# Patient Record
Sex: Female | Born: 1985 | Race: White | Hispanic: No | Marital: Married | Smoking: Former smoker
Health system: Southern US, Community
[De-identification: ages and names within clinical notes are randomized; demographics above are authoritative.]

## PROBLEM LIST (undated history)

## (undated) DIAGNOSIS — L409 Psoriasis, unspecified: Secondary | ICD-10-CM

## (undated) DIAGNOSIS — E876 Hypokalemia: Secondary | ICD-10-CM

## (undated) DIAGNOSIS — E7800 Pure hypercholesterolemia, unspecified: Secondary | ICD-10-CM

## (undated) DIAGNOSIS — G44209 Tension-type headache, unspecified, not intractable: Secondary | ICD-10-CM

## (undated) DIAGNOSIS — F419 Anxiety disorder, unspecified: Secondary | ICD-10-CM

---

## 2014-09-05 NOTE — L&D Delivery Note (Signed)
Delivery Note At 1:03 AM a viable female was delivered via Vaginal, Spontaneous Delivery (Presentation: LOA).  APGAR: 8, 9; weight 6 lb 15.8 oz (3170 g).   Placenta status: Intact, Spontaneous.  Cord: 3 vessels with the following complications: see below.  Cord pH: n/a  Anesthesia:  Fentanyl PCA and pudendal block Episiotomy:  no Lacerations:  1st degree perineal Suture Repair: 3.0 vicryl Est. Blood Loss (mL):  300cc  Mom to postpartum.  Baby to Couplet care / Skin to Skin.  Intrapartum complications: 1. Meconium staining 2. Bradycardia (low baseline) 3. Prolonged rupture of membranes (>18hrs) 4. WBC of 22 --> 30 12 hrs later  See previous notes; mom became complete approximately 23:30 and immediately began pushing for pain/pressure relief.  After 1.5hrs she had rapid descent and delivered a viable female.  Thick meconium was noted and fully-formed clay-coloured stool.  This was assessed post-delivery by myself and the neonatology NP, which she described as "transitional" stool.  It was not thick and tarry of a typical post-partum meconium stool.  Baby was vigorous upon delivery after bulb suction to the oropharynx, the cord was doubly clamped and cut, but due to baby's activity and cry, she was placed on mom's chest.  Cord blood was collected due to Rh negative.  Cord gas was not collected due to initial apgar of 8.  Placenta was spontaneously delivered, intact, with evidence of partial avulsion of the cord. Culture was taken between the chorion and amnion and the placenta was sent to pathology for evaluation.  A very small/shallow first degree laceration was identified and was oozy.  A figure-of-eight of 3-0 vicryl was placed for hemostasis.  The baby was attended to by the pediatric staff and then placed on mom for skin-to-skin.  We sang Happy [place] OuchBirthday do Alana.    About 30 minutes after delivery, I noted that mom was warm; maternal temp was obtained and was 100.9.  With leukocytosis, chorio  was diagnosed.  Since placenta is delivered, Tylenol was administered and will recheck in 1hr.  If still febrile will give one dose of ABX.    Trong Gosling C 07/25/2015, 1:33 AM

## 2015-05-13 ENCOUNTER — Encounter: Payer: Self-pay | Admitting: Emergency Medicine

## 2015-05-13 ENCOUNTER — Emergency Department
Admission: EM | Admit: 2015-05-13 | Discharge: 2015-05-13 | Disposition: A | Discharge status: Home / Self Care | Payer: Managed Care, Other (non HMO) | Attending: Emergency Medicine | Admitting: Emergency Medicine

## 2015-05-13 ENCOUNTER — Emergency Department: Payer: Managed Care, Other (non HMO)

## 2015-05-13 DIAGNOSIS — Z3A28 28 weeks gestation of pregnancy: Secondary | ICD-10-CM | POA: Diagnosis not present

## 2015-05-13 DIAGNOSIS — R1011 Right upper quadrant pain: Secondary | ICD-10-CM | POA: Diagnosis not present

## 2015-05-13 DIAGNOSIS — O9989 Other specified diseases and conditions complicating pregnancy, childbirth and the puerperium: Secondary | ICD-10-CM | POA: Insufficient documentation

## 2015-05-13 LAB — COMPREHENSIVE METABOLIC PANEL
ALBUMIN: 3.1 g/dL — AB (ref 3.5–5.0)
ALK PHOS: 101 U/L (ref 38–126)
ALT: 22 U/L (ref 14–54)
ANION GAP: 7 (ref 5–15)
AST: 25 U/L (ref 15–41)
BILIRUBIN TOTAL: 0.4 mg/dL (ref 0.3–1.2)
BUN: 6 mg/dL (ref 6–20)
CALCIUM: 8.5 mg/dL — AB (ref 8.9–10.3)
CO2: 21 mmol/L — AB (ref 22–32)
CREATININE: 0.4 mg/dL — AB (ref 0.44–1.00)
Chloride: 108 mmol/L (ref 101–111)
GFR calc Af Amer: >60 mL/min (ref >60)
GFR calc non Af Amer: >60 mL/min (ref >60)
GLUCOSE: 88 mg/dL (ref 65–99)
Potassium: 3.3 mmol/L — ABNORMAL LOW (ref 3.5–5.1)
SODIUM: 136 mmol/L (ref 135–145)
TOTAL PROTEIN: 6.4 g/dL — AB (ref 6.5–8.1)

## 2015-05-13 LAB — CBC WITH DIFFERENTIAL/PLATELET
BASOS ABS: 0.1 10*3/uL (ref 0–0.1)
BASOS PCT: 0 %
EOS PCT: 0 %
Eosinophils Absolute: 0 10*3/uL (ref 0–0.7)
HEMATOCRIT: 36.1 % (ref 35.0–47.0)
Hemoglobin: 12.4 g/dL (ref 12.0–16.0)
Lymphocytes Relative: 10 %
Lymphs Abs: 1.7 10*3/uL (ref 1.0–3.6)
MCH: 31.3 pg (ref 26.0–34.0)
MCHC: 34.4 g/dL (ref 32.0–36.0)
MCV: 91.1 fL (ref 80.0–100.0)
MONO ABS: 1 10*3/uL — AB (ref 0.2–0.9)
MONOS PCT: 6 %
Neutro Abs: 13.7 10*3/uL — ABNORMAL HIGH (ref 1.4–6.5)
Neutrophils Relative %: 84 %
PLATELETS: 361 10*3/uL (ref 150–440)
RBC: 3.96 MIL/uL (ref 3.80–5.20)
RDW: 13.8 % (ref 11.5–14.5)
WBC: 16.5 10*3/uL — ABNORMAL HIGH (ref 3.6–11.0)

## 2015-05-13 LAB — URINALYSIS COMPLETE WITH MICROSCOPIC (ARMC ONLY)
Bilirubin Urine: NEGATIVE
GLUCOSE, UA: 150 mg/dL — AB
Hgb urine dipstick: NEGATIVE
KETONES UR: NEGATIVE mg/dL
Leukocytes, UA: NEGATIVE
NITRITE: NEGATIVE
PROTEIN: NEGATIVE mg/dL
SPECIFIC GRAVITY, URINE: 1.005 (ref 1.005–1.030)
pH: 6 (ref 5.0–8.0)

## 2015-05-13 LAB — LIPASE, BLOOD: Lipase: 19 U/L — ABNORMAL LOW (ref 22–51)

## 2015-05-13 MED ORDER — PANTOPRAZOLE SODIUM 20 MG PO TBEC: 20.0000 mg | DELAYED_RELEASE_TABLET | Freq: Every day | ORAL | Status: DC

## 2015-05-13 MED ORDER — ONDANSETRON HCL 4 MG/2ML IJ SOLN
4.0000 mg | Freq: Once | INTRAMUSCULAR | Status: AC
  Administered 2015-05-13: 4 mg via INTRAVENOUS
  Filled 2015-05-13: qty 2

## 2015-05-13 MED ORDER — ACETAMINOPHEN 500 MG PO TABS
1000.0000 mg | ORAL_TABLET | Freq: Once | ORAL | Status: AC
  Administered 2015-05-13: 1000 mg via ORAL
  Filled 2015-05-13: qty 2

## 2015-05-13 MED ORDER — SODIUM CHLORIDE 0.9 % IV BOLUS (SEPSIS)
500.0000 mL | Freq: Once | INTRAVENOUS | Status: AC
  Administered 2015-05-13: 500 mL via INTRAVENOUS

## 2015-05-13 NOTE — ED Provider Notes (Signed)
Time Seen: Approximately ----------------------------------------- 10:00 AM on 05/13/2015 -----------------------------------------   I have reviewed the triage notes  Chief Complaint: Abdominal Pain   History of Present Illness: Bethany Elliott is a 29 y.o. female who is gravida 2 para 1 with one previous miscarriage. Patient's currently [redacted] weeks pregnant. She states her pregnancy is been proceeding normally. She's noticed over the last month she's had some intermittent right upper quadrant abdominal pain with nausea and vomiting especially in the morning. Eyes any fever, dysuria, hematuria. Patient did see her OB/GYN earlier today and stated about the upper abdominal pain was referred here to emergency department for further evaluation. She states her OB/GYN check fetal heart tones and fell her pregnancy was proceeding normally. She states this pain will radiate to the right flank area. Denies any loose stool or diarrhea, melena or hematochezia. She denies any dysuria or hematuria. States she has had some constipation and a decreased appetite. She denies any hematemesis or biliary emesis   History reviewed. No pertinent past medical history.  There are no active problems to display for this patient.   History reviewed. No pertinent past surgical history.  History reviewed. No pertinent past surgical history.  No current outpatient prescriptions on file.  Allergies:  Review of patient's allergies indicates no known allergies.  Family History: No family history on file.  Social History: Social History  Substance Use Topics  . Smoking status: Never Smoker   . Smokeless tobacco: None  . Alcohol Use: No     Review of Systems:   10 point review of systems was performed and was otherwise negative:  Constitutional: No fever Eyes: No visual disturbances ENT: No sore throat, ear pain Cardiac: No chest pain Respiratory: No shortness of breath, wheezing, or  stridor Abdomen: No abdominal pain, no vomiting, No diarrhea Endocrine: No weight loss, No night sweats Extremities: No peripheral edema, cyanosis Skin: No rashes, easy bruising Neurologic: No focal weakness, trouble with speech or swollowing Urologic: No dysuria, Hematuria, or urinary frequency   Physical Exam:  ED Triage Vitals  Enc Vitals Group     BP 05/13/15 0947 119/68 mmHg     Pulse Rate 05/13/15 0947 88     Resp 05/13/15 0947 16     Temp 05/13/15 0947 98.2 F (36.8 C)     Temp Source 05/13/15 0947 Oral     SpO2 05/13/15 0947 99 %     Weight --      Height --      Head Cir --      Peak Flow --      Pain Score 05/13/15 0947 4     Pain Loc --      Pain Edu? --      Excl. in GC? --     General: Awake , Alert , and Oriented times 3; GCS 15 Head: Normal cephalic , atraumatic Eyes: Pupils equal , round, reactive to light Nose/Throat: No nasal drainage, patent upper airway without erythema or exudate.  Neck: Supple, Full range of motion, No anterior adenopathy or palpable thyroid masses Lungs: Clear to ascultation without wheezes , rhonchi, or rales Heart: Regular rate, regular rhythm without murmurs , gallops , or rubs Abdomen:Lieapold  maneuver show 8 cm above the umbilicus.     Patient has focal tenderness in her right upper quadrant to moderate palpation. Mild guarding no rigidity. Bowel sounds are positive and symmetric in all 4 quadrants. No reproducible lower abdominal pain. Extremities: 2 plus symmetric pulses. No  edema, clubbing or cyanosis Neurologic: normal ambulation, Motor symmetric without deficits, sensory intact Skin: warm, dry, no rashes   Labs:   All laboratory work was reviewed including any pertinent negatives or positives listed below:  Labs Reviewed  COMPREHENSIVE METABOLIC PANEL  CBC WITH DIFFERENTIAL/PLATELET  LIPASE, BLOOD  URINALYSIS COMPLETEWITH MICROSCOPIC ([facility] ONLY)   Laboratory work was reviewed and appears to be within normal limits  for second trimester pregnancy  Radiology:    EXAM: US ABDOMEN LIMITED - RIGHT UPPER QUADRANT  COMPARISON: None.  FINDINGS: Gallbladder:  There is a 3 mm non shadowing mildly echogenic focus along the anterior gallbladder wall. No evidence for gallstones or gallbladder sludge. No gallbladder wall thickening. No pericholecystic fluid. Negative sonographic Murphy sign.  Common bile duct:  Diameter: 3 mm  Liver:  No focal lesion identified. Within normal limits in parenchymal echogenicity.  IMPRESSION: No cholelithiasis or sonographic evidence for acute cholecystitis.  3 mm gallbladder polyp.    I personally reviewed the radiologic studies      ED Course:  Differential diagnosis includes but is not exclusive to acute cholecystitis, intrathoracic causes for epigastric abdominal pain, gastritis, duodenitis, pancreatitis, small bowel or large bowel obstruction, abdominal aortic aneurysm, hernia, gastritis, etc. Patient's pain seems to be focused more toward the epigastric area radiating toward her right upper quadrant region. I felt this was unlikely to be an unusual presentation for acute appendicitis given the patient's second trimester pregnancy and location of the appendix. I'll count is elevated that this would be expected at this time of her pregnancy. Patient's afebrile and her pain seems to be characteristic more of reflux and gastritis. Patient was advised that she needs to follow up with her OB/GYN and place her on proton aches and she's been advised she can take over-the-counter Maalox and Mylanta. Right upper quadrant ultrasound showed no evidence of acute cholecystitis. Liver functions are normal as no indications of HELP syndrome   Assessment:  Right upper quadrant abdominal pain uncertain etiology Second trimester pregnancy   Final Clinical Impression:   Final diagnoses:  Right upper quadrant pain     Plan:  Patient was advised to return immediately  if condition worsens. Patient was advised to follow up with her primary care physician or other specialized physicians involved and in their current assessment.            Jennye Moccasin, MD 05/13/15 [phone]

## 2015-05-13 NOTE — Discharge Instructions (Signed)

## 2015-05-13 NOTE — ED Notes (Signed)
Patient transported to Ultrasound 

## 2015-05-13 NOTE — ED Notes (Addendum)
Patient complains of upper right abd pain. Pt sent here from Dr. Elesa Massed to check gallbladder. LBM  x3days ago.

## 2015-07-24 ENCOUNTER — Encounter: Payer: Self-pay | Admitting: *Deleted

## 2015-07-24 ENCOUNTER — Encounter: Payer: Self-pay | Admitting: Anesthesiology

## 2015-07-24 ENCOUNTER — Inpatient Hospital Stay
Admission: EM | Admit: 2015-07-24 | Discharge: 2015-07-26 | DRG: 775 | Disposition: A | Discharge status: Home / Self Care | Payer: Managed Care, Other (non HMO) | Attending: Obstetrics & Gynecology | Admitting: Obstetrics & Gynecology

## 2015-07-24 DIAGNOSIS — O4290 Premature rupture of membranes, unspecified as to length of time between rupture and onset of labor, unspecified weeks of gestation: Secondary | ICD-10-CM | POA: Diagnosis not present

## 2015-07-24 DIAGNOSIS — O4292 Full-term premature rupture of membranes, unspecified as to length of time between rupture and onset of labor: Principal | ICD-10-CM | POA: Diagnosis present

## 2015-07-24 DIAGNOSIS — Z3A38 38 weeks gestation of pregnancy: Secondary | ICD-10-CM

## 2015-07-24 DIAGNOSIS — IMO0002 Reserved for concepts with insufficient information to code with codable children: Secondary | ICD-10-CM | POA: Diagnosis not present

## 2015-07-24 DIAGNOSIS — Z87891 Personal history of nicotine dependence: Secondary | ICD-10-CM | POA: Diagnosis present

## 2015-07-24 DIAGNOSIS — Z6791 Unspecified blood type, Rh negative: Secondary | ICD-10-CM

## 2015-07-24 DIAGNOSIS — O0993 Supervision of high risk pregnancy, unspecified, third trimester: Secondary | ICD-10-CM

## 2015-07-24 DIAGNOSIS — O26899 Other specified pregnancy related conditions, unspecified trimester: Secondary | ICD-10-CM | POA: Diagnosis present

## 2015-07-24 DIAGNOSIS — O411230 Chorioamnionitis, third trimester, not applicable or unspecified: Secondary | ICD-10-CM | POA: Diagnosis present

## 2015-07-24 DIAGNOSIS — D72829 Elevated white blood cell count, unspecified: Secondary | ICD-10-CM | POA: Diagnosis present

## 2015-07-24 DIAGNOSIS — O411290 Chorioamnionitis, unspecified trimester, not applicable or unspecified: Secondary | ICD-10-CM | POA: Diagnosis not present

## 2015-07-24 LAB — CBC WITH DIFFERENTIAL/PLATELET
BASOS PCT: 0 %
Basophils Absolute: 0 10*3/uL (ref 0–0.1)
EOS ABS: 0 10*3/uL (ref 0–0.7)
Eosinophils Relative: 0 %
HEMATOCRIT: 40 % (ref 35.0–47.0)
HEMOGLOBIN: 13.7 g/dL (ref 12.0–16.0)
LYMPHS ABS: 1.3 10*3/uL (ref 1.0–3.6)
Lymphocytes Relative: 4 %
MCH: 31.1 pg (ref 26.0–34.0)
MCHC: 34.2 g/dL (ref 32.0–36.0)
MCV: 91 fL (ref 80.0–100.0)
MONO ABS: 1.7 10*3/uL — AB (ref 0.2–0.9)
MONOS PCT: 6 %
NEUTROS PCT: 90 %
Neutro Abs: 27.3 10*3/uL — ABNORMAL HIGH (ref 1.4–6.5)
Platelets: 351 10*3/uL (ref 150–440)
RBC: 4.39 MIL/uL (ref 3.80–5.20)
RDW: 13.6 % (ref 11.5–14.5)
WBC: 30.3 10*3/uL — ABNORMAL HIGH (ref 3.6–11.0)

## 2015-07-24 LAB — CBC
HEMATOCRIT: 38.6 % (ref 35.0–47.0)
HEMATOCRIT: 36.9 % (ref 35.0–47.0)
HEMOGLOBIN: 13.1 g/dL (ref 12.0–16.0)
HEMOGLOBIN: 12.5 g/dL (ref 12.0–16.0)
MCH: 30.8 pg (ref 26.0–34.0)
MCH: 30.9 pg (ref 26.0–34.0)
MCHC: 34 g/dL (ref 32.0–36.0)
MCHC: 34 g/dL (ref 32.0–36.0)
MCV: 90.6 fL (ref 80.0–100.0)
MCV: 90.9 fL (ref 80.0–100.0)
Platelets: 382 10*3/uL (ref 150–440)
Platelets: 365 10*3/uL (ref 150–440)
RBC: 4.26 MIL/uL (ref 3.80–5.20)
RBC: 4.06 MIL/uL (ref 3.80–5.20)
RDW: 13.7 % (ref 11.5–14.5)
RDW: 13.9 % (ref 11.5–14.5)
WBC: 22.3 10*3/uL — AB (ref 3.6–11.0)
WBC: 31.5 10*3/uL — ABNORMAL HIGH (ref 3.6–11.0)

## 2015-07-24 LAB — ABO/RH: ABO/RH(D): O NEG

## 2015-07-24 LAB — TYPE AND SCREEN
ABO/RH(D): O NEG
ANTIBODY SCREEN: POSITIVE

## 2015-07-24 MED ORDER — LIDOCAINE HCL (PF) 1 % IJ SOLN
30.0000 mL | INTRAMUSCULAR | Status: DC | PRN
  Filled 2015-07-24: qty 30

## 2015-07-24 MED ORDER — DIPHENHYDRAMINE HCL 50 MG/ML IJ SOLN: 12.5000 mg | Freq: Four times a day (QID) | INTRAMUSCULAR | Status: DC | PRN

## 2015-07-24 MED ORDER — NALOXONE HCL 0.4 MG/ML IJ SOLN: 0.4000 mg | INTRAMUSCULAR | Status: DC | PRN

## 2015-07-24 MED ORDER — LACTATED RINGERS IV SOLN: 500.0000 mL | INTRAVENOUS | Status: DC | PRN

## 2015-07-24 MED ORDER — TERBUTALINE SULFATE 1 MG/ML IJ SOLN: 0.2500 mg | Freq: Once | INTRAMUSCULAR | Status: DC | PRN

## 2015-07-24 MED ORDER — FENTANYL 40 MCG/ML IV SOLN
INTRAVENOUS | Status: DC
  Administered 2015-07-24: 15:00:00 via INTRAVENOUS
  Filled 2015-07-24: qty 25

## 2015-07-24 MED ORDER — OXYTOCIN 40 UNITS IN LACTATED RINGERS INFUSION - SIMPLE MED
62.5000 mL/h | INTRAVENOUS | Status: DC
  Administered 2015-07-25: 62.5 mL/h via INTRAVENOUS
  Filled 2015-07-24: qty 1000

## 2015-07-24 MED ORDER — BUTORPHANOL TARTRATE 1 MG/ML IJ SOLN
1.0000 mg | INTRAMUSCULAR | Status: DC | PRN
  Administered 2015-07-24: 1 mg via INTRAVENOUS
  Filled 2015-07-24: qty 1

## 2015-07-24 MED ORDER — FENTANYL CITRATE (PF) 100 MCG/2ML IJ SOLN
50.0000 ug | INTRAMUSCULAR | Status: DC | PRN
  Administered 2015-07-24 (×2): 50 ug via INTRAVENOUS

## 2015-07-24 MED ORDER — DIPHENHYDRAMINE HCL 12.5 MG/5ML PO ELIX
12.5000 mg | ORAL_SOLUTION | Freq: Four times a day (QID) | ORAL | Status: DC | PRN
  Filled 2015-07-24: qty 5

## 2015-07-24 MED ORDER — FENTANYL CITRATE (PF) 100 MCG/2ML IJ SOLN
INTRAMUSCULAR | Status: AC
  Administered 2015-07-24: 50 ug via INTRAVENOUS
  Filled 2015-07-24: qty 2

## 2015-07-24 MED ORDER — OXYTOCIN BOLUS FROM INFUSION
500.0000 mL | INTRAVENOUS | Status: DC
  Administered 2015-07-25: 500 mL via INTRAVENOUS

## 2015-07-24 MED ORDER — FENTANYL 40 MCG/ML IV SOLN: INTRAVENOUS | Status: DC

## 2015-07-24 MED ORDER — OXYTOCIN 40 UNITS IN LACTATED RINGERS INFUSION - SIMPLE MED
1.0000 m[IU]/min | INTRAVENOUS | Status: DC
  Administered 2015-07-24: 1 m[IU]/min via INTRAVENOUS

## 2015-07-24 MED ORDER — SODIUM CHLORIDE 0.9 % IJ SOLN: 9.0000 mL | INTRAMUSCULAR | Status: DC | PRN

## 2015-07-24 MED ORDER — ACETAMINOPHEN 325 MG PO TABS
650.0000 mg | ORAL_TABLET | ORAL | Status: DC | PRN
  Administered 2015-07-25: 650 mg via ORAL
  Filled 2015-07-24: qty 2

## 2015-07-24 MED ORDER — CITRIC ACID-SODIUM CITRATE 334-500 MG/5ML PO SOLN
30.0000 mL | ORAL | Status: DC | PRN
  Filled 2015-07-24: qty 30

## 2015-07-24 MED ORDER — LACTATED RINGERS IV SOLN
INTRAVENOUS | Status: DC
  Administered 2015-07-24 (×3): via INTRAVENOUS

## 2015-07-24 MED ORDER — ONDANSETRON HCL 4 MG/2ML IJ SOLN
4.0000 mg | Freq: Four times a day (QID) | INTRAMUSCULAR | Status: DC | PRN
  Administered 2015-07-24 (×2): 4 mg via INTRAVENOUS
  Filled 2015-07-24 (×2): qty 2

## 2015-07-24 MED ORDER — ONDANSETRON HCL 4 MG/2ML IJ SOLN: 4.0000 mg | Freq: Four times a day (QID) | INTRAMUSCULAR | Status: DC | PRN

## 2015-07-24 NOTE — Progress Notes (Deleted)
Bethany Elliott   Your procedure is scheduled on: 07/24/15   Report to [facility] at 0500.             [facility]             [address]             [place], [place] [id number]  Call this number if you have problems the morning of surgery: [phone]   Remember:   Do not eat food or drink liquids after midnight.     Do not wear jewelry, make-up or nail polish.  Do not wear lotions, powders, or perfumes. You may wear deodorant.  Do not shave prior to surgery.  Do not bring valuables to the hospital.  [facility] is not responsible for any            belongings or valuables.                Contacts, dentures or bridgework may not be worn into surgery.  Leave suitcase in the car. After surgery it may be brought to your room.  For patients admitted to the hospital, discharge time is determined by your             treatment team.               Special Instructions: Preparing the skin before Cesarean Section              To help prevent the risk of infection at your surgical site, we are providing             you with rinse-free Sage 2% Chlorhexidine Gluconate (HCG) disposable             wipes.               The night before surgery:              1. Shower or bathe with warm water             2. Do not apply lotion or perfume             3. Wait one hour after shower, skin should be dry and cool             4. Open Sage wipe package - 2 disposable cloths are inside             5. Wipe the lower abdomen from the pubic line to the navel and hip bone to hip             bone with one cloth             6. Use the second cloth to wipe the front of the upper thighs             7. Allow the area to dry for one minute. DO NOT RINSE             8. Skin may feel "tacky" for several minutes             9. Dress in freshly laundered, clean clothes           10. Do not shower the morning of surgery    Please read over the following fact sheets  that you were given: Coughing and            Deep Breathing and Surgical  Site Infection Prevention

## 2015-07-24 NOTE — Progress Notes (Addendum)
L&D Progress Note  29 year old G1 P0 with PROM at 0200 this AM with labor c/b meconium stained amniotic fluid and fetal bradycardia earlier today. Fetal bradycardia and variable decelerations resolved when low blood pressure resolved (probably due to SE from Stadol). Unable to get epidural due to elevated WBC. Has a Fentanyl PCA currently   S: Feeling more pressure  O:BP 123/64 mmHg  Pulse 65  Temp(Src) 97.7 F (36.5 C) (Oral)  Resp 20  SpO2 98%  LMP 09/11/2014  General : gripping hand rails and breathing thru contractions [id number] with accelerations and moderate variability Cervix: 5/C/0 (was 5cm 2 hours ago-was 2 cm on arrival) IUPC inserted: Contractions q 6 min and mvus<100 Ultrasound: baby is LOA  A: PROM x 15 hours, with inadequate labor Cat 1 FHR  P: Pitocin augmentation  Tameya Kuznia, CNM

## 2015-07-24 NOTE — H&P (Signed)
OB History & Physical   History of Present Illness:  Chief Complaint:   HPI:  Bethany Elliott is a 29 y.o. G1P0 female at 5016w4d dated by 8wk US with EDC of 08/03/15.  She presents to L&D for gross rupture of meconium-particulate fluid at 02:00.  +FM, no CTX, no VB  Pregnancy Issues: 1. Rh neg 2. Meconium SROM 3. ASCUS PAP HPV+, colpo neg   Maternal Medical History:  History reviewed. No pertinent past medical history.  History reviewed. No pertinent past surgical history.  No Known Allergies  Prior to Admission medications   Medication Sig Start Date End Date Taking? Authorizing Provider  pantoprazole (PROTONIX) 20 MG tablet Take 1 tablet (20 mg total) by mouth daily. 05/13/15   Jennye MoccasinBrian S Quigley, MD  Prenatal Vit-Fe Fumarate-FA (MULTIVITAMIN-PRENATAL) 27-0.8 MG TABS tablet Take 1 tablet by mouth daily at 12 noon.    Historical Provider, MD     Prenatal care site: [facility]   Social History: She  reports that she quit smoking about 9 months ago. Her smoking use included Cigarettes. She smoked 0.25 packs per day. She does not have any smokeless tobacco history on file. She reports that she does not drink alcohol or use illicit drugs.  Family History: family history is not on file.   Review of Systems: Negative x 10 systems reviewed except as noted in the HPI.     Physical Exam:  Vital Signs: LMP 09/11/2014 General: no acute distress, actively vomiting since onset of labor HEENT: normocephalic, atraumatic Heart: regular rate & rhythm.  No murmurs/rubs/gallops Lungs: clear to auscultation bilaterally, normal respiratory effort Abdomen: soft, gravid, non-tender;  EFW: 8.1 Pelvic:   External: Normal external female genitalia  Cervix: Dilation: 3 / Effacement (%): 90 / Station: 0    Extremities: non-tender, symmetric,0 edema bilaterally.  DTRs: 2+ Neurologic: Alert & oriented x 3.    No results found for this or any previous visit (from the past 24  hour(s)).  Pertinent Results:  Prenatal Labs: Blood type/Rh O neg  Antibody screen Neg  Rubella Immune  Varicella Immune  RPR NR  HBsAg Neg  HIV NR  GC neg  Chlamydia neg  Genetic screening negative  1 hour GTT 118  3 hour GTT   GBS neg   FHT: 110 mod + accels + variable decels TOCO:q3-4 min SVE: made change to 4/100/0 posterior Cephalic by leopolds  Assessment:  Bethany Daubshley Auerbach is a 29 y.o. G1P0 female at 4616w4d with SROM and meconium staining.   Plan:  1. Admit to Labor & Delivery 2. CBC, T&S, Clrs, IVF 3. GBS  Neg 4. Consents obtained. 5. Continuous efm/toco 6. Notify peds of meconium 7. Labor floor currently full; will manage in Triage room until a L&D room becomes available.   8. Emesis:  Give zofran via IV now 9. Stadol for contraction pain until results of CBC return and platelet count WNL.  ----- Ranae Plumberhelsea Ward, MD Attending Obstetrician and Gynecologist [facility]

## 2015-07-24 NOTE — Plan of Care (Signed)
Fetal heart rate 100. Pt repositioned. o2 on at 10l via mask

## 2015-07-24 NOTE — Plan of Care (Signed)
Dr ward notified and in room. Attempted to apply internal scalp electrode. Marland Kitchen. Unsuccessful due to posterior position of baby

## 2015-07-24 NOTE — Progress Notes (Signed)
Intrapartum progress note  I have spent the last ~hour in the patient's room, this is a summary of that time.  S: patient uncomfortable with contractions and continues to vomit frequently. S/p Stadol and zofran in triage prior to transfer to delivery room.  O: BP 78/51--> 71/51 --> 116/47   P 74  R irratic due to emesis  O2 100% with facemask.  FHT: baseline: 98  Moderate variablilty, + accels, + variables TOCO:  q355min (tracing irregular due to emesis) SVE: 3-4 100% 0 station  FSE attempted to be placed x 2. Difficult to keep on due to very posterior cervix.  Accelerations with incidental fetal head stimulation  Results for orders placed or performed during the hospital encounter of 07/24/15 (from the past 24 hour(s))  Type and screen [facility]     Status: None   Collection Time: 07/24/15  9:00 AM  Result Value Ref Range   ABO/RH(D) O NEG    Antibody Screen POS    Sample Expiration 07/27/2015    Antibody Identification PASSIVELY ACQUIRED ANTI-D   CBC     Status: Abnormal   Collection Time: 07/24/15  9:01 AM  Result Value Ref Range   WBC 22.3 (H) 3.6 - 11.0 K/uL   RBC 4.26 3.80 - 5.20 MIL/uL   Hemoglobin 13.1 12.0 - 16.0 g/dL   HCT 16.138.6 09.635.0 - 04.547.0 %   MCV 90.6 80.0 - 100.0 fL   MCH 30.8 26.0 - 34.0 pg   MCHC 34.0 32.0 - 36.0 g/dL   RDW 40.913.7 81.111.5 - 91.414.5 %   Platelets 382 150 - 440 K/uL  ABO/Rh     Status: None   Collection Time: 07/24/15  9:01 AM  Result Value Ref Range   ABO/RH(D) O NEG    A/P: 29yo G1P0 @ 38.4 with SROM with meconium staining, and resolving hypotension, bradycardia.  1. IUP: category 2 tracing.  With moderate variability and accelerations, oxygenation is not compromised at this point.  I have been cautiously watching the fetal activity and so far there hasn't been a significant deviation in baseline nor activity of the strip.  I am guarded and have expressed this to the patient and her family, with any signs of compromise I will not  hesitate to perform a cesarean delivery.  They understand this plan.   2. Leukocytosis: patient states she is just recovering from a cold, but otherwise denies any fever/chills, current feeling of malaise, diarrhea, pain other than labor.  Epidural may not be possible due to this elevation.  Will recheck later today. 3. Meconium staining: peds aware 4. Bradycardia: thus far has not been transient.  Not sure if this is a result of stadol, hypotension, or if due to an unknown cause.  Differential includes heart block.   5. Hypotension: also could be secondary to Stadol, normal BPs in office were 100s/60s.  At time of note, they have started to resolve.  Patient vomiting often, could be secondary to this as well.   This is a complex patient, will monitor closely throughout her labor and determine if we should continue with expectant management/active management or move to cesarean.    ----- Ranae Plumberhelsea Ward, MD Attending Obstetrician and Gynecologist [facility]

## 2015-07-24 NOTE — Anesthesia Preprocedure Evaluation (Deleted)
Anesthesia Evaluation   Patient awake    Reviewed: Allergy & Precautions, NPO status   Airway Mallampati: II       Dental no notable dental hx.    Pulmonary former smoker,    Pulmonary exam normal        Cardiovascular Exercise Tolerance: Good  Rhythm:Regular Rate:Normal     Neuro/Psych    GI/Hepatic negative GI ROS, Neg liver ROS,   Endo/Other  negative endocrine ROS  Renal/GU negative Renal ROS     Musculoskeletal negative musculoskeletal ROS (+)   Abdominal Normal abdominal exam  (+)   Peds  Hematology negative hematology ROS (+)   Anesthesia Other Findings   Reproductive/Obstetrics negative OB ROS                            Anesthesia Physical Anesthesia Plan  ASA: II and emergent  Anesthesia Plan: Spinal   Post-op Pain Management:    Induction:   Airway Management Planned: Nasal Cannula  Additional Equipment:   Intra-op Plan:   Post-operative Plan:   Informed Consent: I have reviewed the patients History and Physical, chart, labs and discussed the procedure including the risks, benefits and alternatives for the proposed anesthesia with the patient or authorized representative who has indicated his/her understanding and acceptance.     Plan Discussed with: Surgeon  Anesthesia Plan Comments:         Anesthesia Quick Evaluation

## 2015-07-25 DIAGNOSIS — IMO0002 Reserved for concepts with insufficient information to code with codable children: Secondary | ICD-10-CM | POA: Diagnosis not present

## 2015-07-25 DIAGNOSIS — O411290 Chorioamnionitis, unspecified trimester, not applicable or unspecified: Secondary | ICD-10-CM | POA: Diagnosis not present

## 2015-07-25 DIAGNOSIS — O26899 Other specified pregnancy related conditions, unspecified trimester: Secondary | ICD-10-CM | POA: Diagnosis present

## 2015-07-25 DIAGNOSIS — Z6791 Unspecified blood type, Rh negative: Secondary | ICD-10-CM

## 2015-07-25 DIAGNOSIS — O0993 Supervision of high risk pregnancy, unspecified, third trimester: Secondary | ICD-10-CM

## 2015-07-25 DIAGNOSIS — O4290 Premature rupture of membranes, unspecified as to length of time between rupture and onset of labor, unspecified weeks of gestation: Secondary | ICD-10-CM | POA: Diagnosis not present

## 2015-07-25 DIAGNOSIS — D72829 Elevated white blood cell count, unspecified: Secondary | ICD-10-CM | POA: Diagnosis present

## 2015-07-25 LAB — CBC
HCT: 34.8 % — ABNORMAL LOW (ref 35.0–47.0)
Hemoglobin: 11.7 g/dL — ABNORMAL LOW (ref 12.0–16.0)
MCH: 30.3 pg (ref 26.0–34.0)
MCHC: 33.6 g/dL (ref 32.0–36.0)
MCV: 90.1 fL (ref 80.0–100.0)
PLATELETS: 356 10*3/uL (ref 150–440)
RBC: 3.86 MIL/uL (ref 3.80–5.20)
RDW: 13.8 % (ref 11.5–14.5)
WBC: 38.9 10*3/uL — ABNORMAL HIGH (ref 3.6–11.0)

## 2015-07-25 LAB — RPR: RPR: NONREACTIVE

## 2015-07-25 LAB — FETAL SCREEN: Fetal Screen: NEGATIVE

## 2015-07-25 MED ORDER — SIMETHICONE 80 MG PO CHEW: 80.0000 mg | CHEWABLE_TABLET | ORAL | Status: DC | PRN

## 2015-07-25 MED ORDER — DIPHENHYDRAMINE HCL 25 MG PO CAPS: 25.0000 mg | ORAL_CAPSULE | Freq: Four times a day (QID) | ORAL | Status: DC | PRN

## 2015-07-25 MED ORDER — WITCH HAZEL-GLYCERIN EX PADS: 1.0000 "application " | MEDICATED_PAD | CUTANEOUS | Status: DC | PRN

## 2015-07-25 MED ORDER — LANOLIN HYDROUS EX OINT: TOPICAL_OINTMENT | CUTANEOUS | Status: DC | PRN

## 2015-07-25 MED ORDER — RHO D IMMUNE GLOBULIN 1500 UNIT/2ML IJ SOSY
300.0000 ug | PREFILLED_SYRINGE | Freq: Once | INTRAMUSCULAR | Status: AC
  Administered 2015-07-25: 300 ug via INTRAMUSCULAR
  Filled 2015-07-25: qty 2

## 2015-07-25 MED ORDER — BENZOCAINE-MENTHOL 20-0.5 % EX AERO
1.0000 "application " | INHALATION_SPRAY | CUTANEOUS | Status: DC | PRN
  Filled 2015-07-25: qty 56

## 2015-07-25 MED ORDER — OXYTOCIN 40 UNITS IN LACTATED RINGERS INFUSION - SIMPLE MED: 62.5000 mL/h | INTRAVENOUS | Status: DC | PRN

## 2015-07-25 MED ORDER — ACETAMINOPHEN 325 MG PO TABS: 650.0000 mg | ORAL_TABLET | ORAL | Status: DC | PRN

## 2015-07-25 MED ORDER — DIBUCAINE 1 % RE OINT
1.0000 | TOPICAL_OINTMENT | RECTAL | Status: DC | PRN
Start: 2015-07-25 — End: 2015-07-26

## 2015-07-25 MED ORDER — ONDANSETRON HCL 4 MG/2ML IJ SOLN: 4.0000 mg | INTRAMUSCULAR | Status: DC | PRN

## 2015-07-25 MED ORDER — ONDANSETRON HCL 4 MG PO TABS: 4.0000 mg | ORAL_TABLET | ORAL | Status: DC | PRN

## 2015-07-25 MED ORDER — DOCUSATE SODIUM 100 MG PO CAPS
100.0000 mg | ORAL_CAPSULE | Freq: Two times a day (BID) | ORAL | Status: DC
  Administered 2015-07-25 – 2015-07-26 (×2): 100 mg via ORAL
  Filled 2015-07-25 (×2): qty 1

## 2015-07-25 MED ORDER — IBUPROFEN 600 MG PO TABS
600.0000 mg | ORAL_TABLET | Freq: Four times a day (QID) | ORAL | Status: DC
  Administered 2015-07-25 – 2015-07-26 (×2): 600 mg via ORAL
  Filled 2015-07-25 (×6): qty 1

## 2015-07-25 MED ORDER — OXYCODONE-ACETAMINOPHEN 5-325 MG PO TABS: 1.0000 | ORAL_TABLET | ORAL | Status: DC | PRN

## 2015-07-25 MED ORDER — SODIUM CHLORIDE 0.9 % IJ SOLN
3.0000 mL | Freq: Four times a day (QID) | INTRAMUSCULAR | Status: DC
  Administered 2015-07-25 – 2015-07-26 (×3): 3 mL via INTRAVENOUS

## 2015-07-25 MED ORDER — PRENATAL MULTIVITAMIN CH
1.0000 | ORAL_TABLET | Freq: Every day | ORAL | Status: DC
  Administered 2015-07-25 – 2015-07-26 (×2): 1 via ORAL
  Filled 2015-07-25 (×2): qty 1

## 2015-07-25 NOTE — Discharge Summary (Signed)
Obstetrical Discharge Summary  Patient Name: Bethany Elliott DOB: 1986-04-09 MRN: [id number]  Date of Admission: 07/24/2015 Date of Discharge: 07/26/2015  Primary OB: [facility]  Gestational Age at Delivery: [redacted]w[redacted]d   Antepartum complications: Rh negative  Admitting Diagnosis: SROM with meconium  Secondary Diagnosis: prolonged rupture of membranes, chorioamnionitis, leukocytosis, Rh negative Patient Active Problem List   Diagnosis Date Noted  . Rh negative, delivered, current hospitalization 07/25/2015  . Leukocytosis 07/25/2015  . Postpartum care following vaginal delivery 07/24/2015    Augmentation: Pitocin Complications: Intrauterine Inflammation or infection (Chorioamniotis)/ leukocytosis Intrapartum complications/course: Bethany Elliott presented to L&D after spontaneously rupturing at 02:00 for meconium.  Bethany Elliott was violently vomiting throughout the course of Bethany Elliott admission.  Fetal bradycardia was evident, baseline, but with moderate variability and accels.  FSE was placed.  IUPC placed later in the day after no progress for >2 hours.  Pitocin started for augmentation.  Due to leukocytosis present on admission, epidural was not an option.  A fentanyl PCA was administered.  Bethany Elliott well, and became complete approximately 23:30 and immediately began pushing for pain/pressure relief.  After 1.5hrs Bethany Elliott had rapid descent and delivered a viable female.  Thick meconium was noted and fully-formed clay-coloured stool.  This was assessed post-delivery by myself and the neonatology NP, which Bethany Elliott described as "transitional" stool.  It was not thick and tarry of a typical post-partum meconium stool.  Bethany Elliott was vigorous upon delivery after bulb suction to the oropharynx, the cord was doubly clamped and cut, but due to Bethany Elliott's activity and cry, Bethany Elliott was placed on Bethany Elliott's chest.  Cord blood was collected due to Rh negative.  Cord gas was not collected due to initial apgar of 8.  Placenta was spontaneously delivered,  intact, with evidence of partial avulsion of the cord. Culture was taken between the chorion and amnion and the placenta was sent to pathology for evaluation.  A very small/shallow first degree laceration was identified and was oozy.  A figure-of-eight of 3-0 vicryl was placed for hemostasis.  The Bethany Elliott was attended to by the pediatric staff and then placed on Bethany Elliott for skin-to-skin.  We sang Happy [place] Ouch do Alana.    About 30 minutes after delivery, Bethany Elliott noted that Bethany Elliott was warm; maternal temp was obtained and was 100.9.  With leukocytosis, chorio was diagnosed.     Date of Delivery: 07/25/15 Delivered By: Leeroy Bock Ward Delivery Type: spontaneous vaginal delivery Anesthesia: epidural Placenta: sponatneous Laceration: 1sr degree Episiotomy: none Newborn Data: Live born female/ Alana/ Bottle  Birth Weight: 6 lb 15.8 oz (3170 g) APGAR: 8, 9    Discharge Physical Exam: BP 112/55 mmHg  Pulse 70  Temp(Src) 97.8 F (36.6 C) (Oral)  Resp 16  SpO2 98%  LMP 09/11/2014  Breastfeeding? Unknown  General: NAD ABD: s/nd/nt, Elliott firm and below the umbilicus Lochia: moderate  DVT Evaluation: LE non-ttp, no evidence of DVT on exam.  Results for orders placed or performed during the hospital encounter of 07/24/15 (from the past 72 hour(s))  Type and screen [facility]     Status: None   Collection Time: 07/24/15  9:00 AM  Result Value Ref Range   ABO/RH(D) O NEG    Antibody Screen POS    Sample Expiration 07/27/2015    Antibody Identification PASSIVELY ACQUIRED ANTI-D   CBC     Status: Abnormal   Collection Time: 07/24/15  9:01 AM  Result Value Ref Range   WBC 22.3 (H) 3.6 - 11.0 K/uL   RBC  4.26 3.80 - 5.20 MIL/uL   Hemoglobin 13.1 12.0 - 16.0 g/dL   HCT 16.1 09.6 - 04.5 %   MCV 90.6 80.0 - 100.0 fL   MCH 30.8 26.0 - 34.0 pg   MCHC 34.0 32.0 - 36.0 g/dL   RDW 40.9 81.1 - 91.4 %   Platelets 382 150 - 440 K/uL  RPR     Status: None   Collection Time: 07/24/15  9:01  AM  Result Value Ref Range   RPR Ser Ql Non Reactive Non Reactive    Comment: (NOTE) Performed At: [facility] [address] [place], [place] [id number] Mila Homer MD [id number]   ABO/Rh     Status: None   Collection Time: 07/24/15  9:01 AM  Result Value Ref Range   ABO/RH(D) O NEG   CBC     Status: Abnormal   Collection Time: 07/24/15 10:23 PM  Result Value Ref Range   WBC 31.5 (H) 3.6 - 11.0 K/uL   RBC 4.06 3.80 - 5.20 MIL/uL   Hemoglobin 12.5 12.0 - 16.0 g/dL   HCT 52.8 41.3 - 24.4 %   MCV 90.9 80.0 - 100.0 fL   MCH 30.9 26.0 - 34.0 pg   MCHC 34.0 32.0 - 36.0 g/dL   RDW 01.0 27.2 - 53.6 %   Platelets 365 150 - 440 K/uL  CBC with Differential/Platelet     Status: Abnormal   Collection Time: 07/24/15 10:45 PM  Result Value Ref Range   WBC 30.3 (H) 3.6 - 11.0 K/uL   RBC 4.39 3.80 - 5.20 MIL/uL   Hemoglobin 13.7 12.0 - 16.0 g/dL   HCT 64.4 03.4 - 74.2 %   MCV 91.0 80.0 - 100.0 fL   MCH 31.1 26.0 - 34.0 pg   MCHC 34.2 32.0 - 36.0 g/dL   RDW 59.5 63.8 - 75.6 %   Platelets 351 150 - 440 K/uL   Neutrophils Relative % 90 %   Neutro Abs 27.3 (H) 1.4 - 6.5 K/uL   Lymphocytes Relative 4 %   Lymphs Abs 1.3 1.0 - 3.6 K/uL   Monocytes Relative 6 %   Monocytes Absolute 1.7 (H) 0.2 - 0.9 K/uL   Eosinophils Relative 0 %   Eosinophils Absolute 0.0 0 - 0.7 K/uL   Basophils Relative 0 %   Basophils Absolute 0.0 0 - 0.1 K/uL  Culture, blood (routine x 2)     Status: None (Preliminary result)   Collection Time: 07/24/15 10:46 PM  Result Value Ref Range   Specimen Description BLOOD RIGHT ASSIST CONTROL    Special Requests BOTTLES DRAWN AEROBIC AND ANAEROBIC 7CC    Culture NO GROWTH 2 DAYS    Report Status PENDING   Culture, blood (routine x 2)     Status: None (Preliminary result)   Collection Time: 07/24/15 10:46 PM  Result Value Ref Range   Specimen Description BLOOD LEFT HAND    Special Requests BOTTLES DRAWN AEROBIC AND ANAEROBIC 7CC    Culture NO GROWTH 2  DAYS    Report Status PENDING   Wound culture     Status: None (Preliminary result)   Collection Time: 07/25/15  1:36 AM  Result Value Ref Range   Specimen Description ABSCESS    Special Requests NONE    Gram Stain PENDING    Culture NO GROWTH 1 DAY    Report Status PENDING   Fetal screen     Status: None   Collection Time: 07/25/15  5:15 AM  Result Value Ref Range   Fetal Screen NEG   Rhogam injection     Status: None   Collection Time: 07/25/15  5:15 AM  Result Value Ref Range   Unit Number [id number]    Blood Component Type RHIG    Unit division 00    Status of Unit ISSUED,FINAL    Transfusion Status OK TO TRANSFUSE   CBC     Status: Abnormal   Collection Time: 07/25/15  5:15 AM  Result Value Ref Range   WBC 38.9 (H) 3.6 - 11.0 K/uL   RBC 3.86 3.80 - 5.20 MIL/uL   Hemoglobin 11.7 (L) 12.0 - 16.0 g/dL   HCT 29.5 (L) 62.1 - 30.8 %   MCV 90.1 80.0 - 100.0 fL   MCH 30.3 26.0 - 34.0 pg   MCHC 33.6 32.0 - 36.0 g/dL   RDW 65.7 84.6 - 96.2 %   Platelets 356 150 - 440 K/uL  CBC     Status: Abnormal   Collection Time: 07/26/15 11:13 AM  Result Value Ref Range   WBC 24.8 (H) 3.6 - 11.0 K/uL   RBC 3.98 3.80 - 5.20 MIL/uL   Hemoglobin 12.6 12.0 - 16.0 g/dL   HCT 95.2 84.1 - 32.4 %   MCV 90.1 80.0 - 100.0 fL   MCH 31.7 26.0 - 34.0 pg   MCHC 35.2 32.0 - 36.0 g/dL   RDW 40.1 02.7 - 25.3 %   Platelets 422 150 - 440 K/uL    Post partum course: Patient's postpartum course was complicated by chorioamnionitis and had a fever immediately postpartum and a WBC that reached 38K a few hours postpartum. Patient had no further fever and almost 36 hours post delivery Bethany Elliott WBC decreased to 24.8K and Bethany Elliott was non tender. By the time of Bethany Elliott discharge on PPD#1, Bethany Elliott reallly was pain free, Bethany Elliott had appropriate lochia, was ambulating, voiding without difficulty, tolerating a regular diet and was deemed stable for discharge. Postpartum Procedures: Rhogam Disposition: stable, discharge to  home. Bethany Elliott Feeding:bottle Bethany Elliott Disposition: home with Bethany Elliott  Rh Immune globulin given:yes (Bethany Elliott O POS) Rubella vaccine given: no Tdap vaccine given in AP or PP setting: antepartum Flu vaccine given in AP or PP setting: antepartum   Contraception: BCPs ([place] 24 at 4 weeks pp)  Prenatal Labs:  Blood type/Rh O neg  Antibody screen Neg  Rubella Immune  Varicella Immune  RPR NR  HBsAg Neg  HIV NR  GC neg  Chlamydia neg  Genetic screening negative  1 hour GTT 118  3 hour GTT   GBS neg       PAP = ascus HPV+   Plan:  Jhayla Podgorski was discharged to home in good condition. Follow-up appointment at [facility] with Dr Elesa Massed in 6 weeks   Discharge Medications:   Medication List    TAKE these medications        benzocaine-Menthol 20-0.5 % Aero  Commonly known as:  DERMOPLAST  Apply 1 application topically as needed for irritation (perineal discomfort).     ibuprofen 600 MG tablet  Commonly known as:  ADVIL,MOTRIN  Take 1 tablet (600 mg total) by mouth every 6 (six) hours as needed.     multivitamin-prenatal 27-0.8 MG Tabs tablet  Take 1 tablet by mouth daily at 12 noon.     Norethindrone Acetate-Ethinyl Estrad-FE 1-20 MG-MCG(24) tablet  Commonly known as:  LOESTRIN 24 FE  Take 1 tablet by mouth daily.     pantoprazole 20 MG  tablet  Commonly known as:  PROTONIX  Take 1 tablet (20 mg total) by mouth daily.        Signed: Farrel ConnersGUTIERREZ, Tracy Gerken, CNM

## 2015-07-25 NOTE — Progress Notes (Signed)
Intrapartum progress note  Patient was showing incredible tolerance and control of contractions, but was still very uncomfortable.Pudendal block placed at 21:23.  I drew another CBC to see if her leukocytosis had resolved or improved, and it rose from 22 to 30. Patient remains afebrile. Blood cultures collected due to elevated and unexplained WBC.   Per Anesthesia, Spinal/epidural not an option.  Continue Fentanyl PCA.    FHT 120 mod, no accels, + variables with pushing TOCO q2-3 min  CALLED TO ROOM FOR DELIVERY

## 2015-07-25 NOTE — Progress Notes (Addendum)
Post Partum Day 0 Subjective: Feeling much better. Bleeding decreasing. Tolerating regular diet. Slight sore throat since vomiting yesterday. Had a stuffy nose/cold  this past week.   Objective: Blood pressure 132/114, pulse 74, temperature 98.6 F (37 C), temperature source Oral, resp. rate 18, last menstrual period 09/11/2014, SpO2 98 %, unknown if currently breastfeeding.  Physical Exam:  General: alert, cooperative and no distress Lochia: appropriate Uterine Fundus: firm/ sl tender/ ML atU to U-1 DVT Evaluation: No evidence of DVT seen on physical exam.   Recent Labs  07/24/15 2245 07/25/15 0515  HGB 13.7 11.7*  HCT 40.0 34.8*  WBC 30.3* 38.9*  PLT 351 356    Assessment/Plan: Leukocytosis-no current fever  Monitor for fever and source of infection. Diagnosed with chorio as ran fever immediately postpartum-but would expect WBCs to start decreasing. Repeat CBC in AM Bottle feeding Saline lock Baby O POS/ Mother O neg-will need Rhogam  Ruvi Fullenwider, CNM   LOS: 1 day   Yony Roulston 07/25/2015, 9:11 AM

## 2015-07-26 LAB — RHOGAM INJECTION: UNIT DIVISION: 0

## 2015-07-26 LAB — CBC
HEMATOCRIT: 35.8 % (ref 35.0–47.0)
HEMOGLOBIN: 12.6 g/dL (ref 12.0–16.0)
MCH: 31.7 pg (ref 26.0–34.0)
MCHC: 35.2 g/dL (ref 32.0–36.0)
MCV: 90.1 fL (ref 80.0–100.0)
Platelets: 422 10*3/uL (ref 150–440)
RBC: 3.98 MIL/uL (ref 3.80–5.20)
RDW: 14 % (ref 11.5–14.5)
WBC: 24.8 10*3/uL — ABNORMAL HIGH (ref 3.6–11.0)

## 2015-07-26 MED ORDER — BENZOCAINE-MENTHOL 20-0.5 % EX AERO: 1.0000 "application " | INHALATION_SPRAY | CUTANEOUS | Status: DC | PRN

## 2015-07-26 MED ORDER — NORETHIN ACE-ETH ESTRAD-FE 1-20 MG-MCG(24) PO TABS: 1.0000 | ORAL_TABLET | Freq: Every day | ORAL | Status: DC

## 2015-07-26 MED ORDER — IBUPROFEN 600 MG PO TABS: 600.0000 mg | ORAL_TABLET | Freq: Four times a day (QID) | ORAL | Status: DC | PRN

## 2015-07-26 NOTE — Progress Notes (Signed)
Prenatal records indicate that pt received TDaP vaccine on 05/27/15 and Infuenza vaccine on 06/20/15. Reynold BowenSusan Paisley Sherryl Valido, RN 07/26/2015 10:25 AM

## 2015-07-26 NOTE — Discharge Instructions (Signed)
Call your doctor for increased pain or vaginal bleeding, temperature above 100.4, depression, or concerns.  No strenuous activity or heavy lifting for 6 weeks.  No intercourse, tampons, douching, or enemas for 6 weeks.  No tub baths-showers only.  No driving for 2 weeks or while taking pain medications.  Continue prenatal vitamin and iron.   °

## 2015-07-26 NOTE — Progress Notes (Signed)
Discharge instructions provided.  Pt and sig other verbalize understanding of all instructions and follow-up care.  Pt discharged to home with infant at 1420 on 07/26/15 via wheelchair by volunteer. Reynold BowenSusan Paisley Celenia Hruska, RN 07/26/2015 3:55 PM

## 2015-07-28 LAB — SURGICAL PATHOLOGY

## 2015-07-28 LAB — WOUND CULTURE: Culture: NO GROWTH

## 2015-07-29 LAB — CULTURE, BLOOD (ROUTINE X 2)
Culture: NO GROWTH
Culture: NO GROWTH

## 2017-09-11 ENCOUNTER — Other Ambulatory Visit: Payer: Self-pay

## 2017-09-11 ENCOUNTER — Emergency Department
Admission: EM | Admit: 2017-09-11 | Discharge: 2017-09-11 | Disposition: A | Discharge status: Home / Self Care | Payer: Commercial Managed Care - PPO | Attending: Emergency Medicine | Admitting: Emergency Medicine

## 2017-09-11 ENCOUNTER — Emergency Department: Payer: Commercial Managed Care - PPO

## 2017-09-11 ENCOUNTER — Encounter: Payer: Self-pay | Admitting: Emergency Medicine

## 2017-09-11 DIAGNOSIS — E860 Dehydration: Secondary | ICD-10-CM | POA: Diagnosis not present

## 2017-09-11 DIAGNOSIS — R69 Illness, unspecified: Secondary | ICD-10-CM

## 2017-09-11 DIAGNOSIS — R112 Nausea with vomiting, unspecified: Secondary | ICD-10-CM | POA: Diagnosis not present

## 2017-09-11 DIAGNOSIS — R05 Cough: Secondary | ICD-10-CM | POA: Diagnosis present

## 2017-09-11 DIAGNOSIS — Z79899 Other long term (current) drug therapy: Secondary | ICD-10-CM | POA: Diagnosis not present

## 2017-09-11 DIAGNOSIS — R509 Fever, unspecified: Secondary | ICD-10-CM | POA: Insufficient documentation

## 2017-09-11 DIAGNOSIS — J111 Influenza due to unidentified influenza virus with other respiratory manifestations: Secondary | ICD-10-CM

## 2017-09-11 DIAGNOSIS — Z87891 Personal history of nicotine dependence: Secondary | ICD-10-CM | POA: Diagnosis not present

## 2017-09-11 LAB — INFLUENZA PANEL BY PCR (TYPE A & B)
Influenza A By PCR: NEGATIVE
Influenza B By PCR: NEGATIVE

## 2017-09-11 LAB — URINALYSIS, COMPLETE (UACMP) WITH MICROSCOPIC
BILIRUBIN URINE: NEGATIVE
GLUCOSE, UA: NEGATIVE mg/dL
Ketones, ur: 20 mg/dL — AB
LEUKOCYTES UA: NEGATIVE
NITRITE: NEGATIVE
PROTEIN: 100 mg/dL — AB
Specific Gravity, Urine: 1.029 (ref 1.005–1.030)
pH: 5 (ref 5.0–8.0)

## 2017-09-11 LAB — LIPASE, BLOOD: Lipase: 19 U/L (ref 11–51)

## 2017-09-11 LAB — COMPREHENSIVE METABOLIC PANEL
ALK PHOS: 71 U/L (ref 38–126)
ALT: 19 U/L (ref 14–54)
AST: 26 U/L (ref 15–41)
Albumin: 5 g/dL (ref 3.5–5.0)
Anion gap: 14 (ref 5–15)
BILIRUBIN TOTAL: 0.7 mg/dL (ref 0.3–1.2)
BUN: 18 mg/dL (ref 6–20)
CALCIUM: 9.9 mg/dL (ref 8.9–10.3)
CO2: 20 mmol/L — ABNORMAL LOW (ref 22–32)
Chloride: 107 mmol/L (ref 101–111)
Creatinine, Ser: 0.91 mg/dL (ref 0.44–1.00)
GFR calc Af Amer: >60 mL/min (ref >60)
Glucose, Bld: 124 mg/dL — ABNORMAL HIGH (ref 65–99)
POTASSIUM: 3.4 mmol/L — AB (ref 3.5–5.1)
Sodium: 141 mmol/L (ref 135–145)
Total Protein: 8.5 g/dL — ABNORMAL HIGH (ref 6.5–8.1)

## 2017-09-11 LAB — CBC WITH DIFFERENTIAL/PLATELET
BASOS ABS: 0.1 10*3/uL (ref 0–0.1)
Basophils Relative: 1 %
Eosinophils Absolute: 0.1 10*3/uL (ref 0–0.7)
Eosinophils Relative: 1 %
HEMATOCRIT: 51.6 % — AB (ref 35.0–47.0)
Hemoglobin: 17.6 g/dL — ABNORMAL HIGH (ref 12.0–16.0)
LYMPHS PCT: 13 %
Lymphs Abs: 1.9 10*3/uL (ref 1.0–3.6)
MCH: 30.2 pg (ref 26.0–34.0)
MCHC: 34.2 g/dL (ref 32.0–36.0)
MCV: 88.3 fL (ref 80.0–100.0)
MONO ABS: 0.7 10*3/uL (ref 0.2–0.9)
MONOS PCT: 5 %
NEUTROS ABS: 11.7 10*3/uL — AB (ref 1.4–6.5)
Neutrophils Relative %: 80 %
Platelets: 537 10*3/uL — ABNORMAL HIGH (ref 150–440)
RBC: 5.84 MIL/uL — ABNORMAL HIGH (ref 3.80–5.20)
RDW: 13 % (ref 11.5–14.5)
WBC: 14.4 10*3/uL — ABNORMAL HIGH (ref 3.6–11.0)

## 2017-09-11 LAB — HCG, QUANTITATIVE, PREGNANCY

## 2017-09-11 LAB — POCT PREGNANCY, URINE: Preg Test, Ur: NEGATIVE

## 2017-09-11 MED ORDER — ONDANSETRON 4 MG PO TBDP: 4.0000 mg | ORAL_TABLET | Freq: Three times a day (TID) | ORAL | 0 refills | Status: DC | PRN

## 2017-09-11 MED ORDER — ONDANSETRON HCL 4 MG/2ML IJ SOLN
4.0000 mg | Freq: Once | INTRAMUSCULAR | Status: AC
  Administered 2017-09-11: 4 mg via INTRAVENOUS
  Filled 2017-09-11: qty 2

## 2017-09-11 MED ORDER — IBUPROFEN 600 MG PO TABS: 600.0000 mg | ORAL_TABLET | Freq: Three times a day (TID) | ORAL | 0 refills | Status: DC | PRN

## 2017-09-11 MED ORDER — KETOROLAC TROMETHAMINE 30 MG/ML IJ SOLN
15.0000 mg | Freq: Once | INTRAMUSCULAR | Status: AC
  Administered 2017-09-11: 15 mg via INTRAVENOUS

## 2017-09-11 MED ORDER — SODIUM CHLORIDE 0.9 % IV BOLUS (SEPSIS)
1000.0000 mL | Freq: Once | INTRAVENOUS | Status: AC
  Administered 2017-09-11: 1000 mL via INTRAVENOUS

## 2017-09-11 MED ORDER — LORAZEPAM 2 MG/ML IJ SOLN
1.0000 mg | Freq: Once | INTRAMUSCULAR | Status: AC
  Administered 2017-09-11: 1 mg via INTRAVENOUS

## 2017-09-11 MED ORDER — KETOROLAC TROMETHAMINE 30 MG/ML IJ SOLN
INTRAMUSCULAR | Status: AC
  Administered 2017-09-11: 15 mg via INTRAVENOUS
  Filled 2017-09-11: qty 1

## 2017-09-11 MED ORDER — BUTALBITAL-APAP-CAFFEINE 50-325-40 MG PO TABS: 1.0000 | ORAL_TABLET | Freq: Four times a day (QID) | ORAL | 0 refills | Status: AC | PRN

## 2017-09-11 MED ORDER — LORAZEPAM 2 MG/ML IJ SOLN
INTRAMUSCULAR | Status: AC
  Administered 2017-09-11: 1 mg via INTRAVENOUS
  Filled 2017-09-11: qty 1

## 2017-09-11 NOTE — ED Notes (Signed)
Patient transported to X-ray 

## 2017-09-11 NOTE — ED Notes (Signed)
Pt alert and oriented X4, active, cooperative, pt in NAD. RR even and unlabored, color WNL.  Pt informed to return if any life threatening symptoms occur.  Discharge and followup instructions reviewed. Pt left with all of belongings. 

## 2017-09-11 NOTE — ED Notes (Signed)
Pt reporting nausea, vomiting, SOB X 2 days. Pt appears anxious, hyperventilating, skin reddened, pt face flushed. Dry heaving at this time.

## 2017-09-11 NOTE — ED Triage Notes (Signed)
Cough and fever x 3 weeks. Nausea with emesis x 8 this am.

## 2017-09-11 NOTE — ED Notes (Signed)
PT reports feeling able to sit through chest Xray after medication administration. Xray notified.

## 2017-09-11 NOTE — ED Provider Notes (Signed)
[facility] Emergency Department Provider Note  ____________________________________________   First MD Initiated Contact with Patient 09/11/17 1252     (approximate)  I have reviewed the triage vital signs and the nursing notes.   HISTORY  Chief Complaint Cough   HPI Bethany Elliott is a 32 y.o. female who self presents to the emergency department with 3 weeks of intermittent cough low-grade fever and fatigue.  She finally presented to the emergency department today because she developed nausea and vomited 8 times this morning.  No diarrhea.  Her fever and malaise lasted about 3 weeks but became acutely worse for the past 2 days associated with some shortness of breath and nonproductive cough.  She did not get her flu shot this year.  Feels dehydrated and has had difficulty keeping food and water down.  She denies sore throat.  Her symptoms are now moderate to severe.  Gradual onset.  They seem to be worsened by trying to eat.  History reviewed. No pertinent past medical history.  Patient Active Problem List   Diagnosis Date Noted  . Rh negative, delivered, current hospitalization 07/25/2015  . Leukocytosis 07/25/2015  . Postpartum care following vaginal delivery 07/24/2015    History reviewed. No pertinent surgical history.  Prior to Admission medications   Medication Sig Start Date End Date Taking? Authorizing Provider  benzocaine-Menthol (DERMOPLAST) 20-0.5 % AERO Apply 1 application topically as needed for irritation (perineal discomfort). 07/26/15   Farrel ConnersGutierrez, Colleen, CNM  butalbital-acetaminophen-caffeine (FIORICET, ESGIC) [id number] MG tablet Take 1-2 tablets by mouth every 6 (six) hours as needed for headache. 09/11/17 09/11/18  Merrily Brittleifenbark, Carver Murakami, MD  ibuprofen (ADVIL,MOTRIN) 600 MG tablet Take 1 tablet (600 mg total) by mouth every 6 (six) hours as needed. 07/26/15   Farrel ConnersGutierrez, Colleen, CNM  ibuprofen (ADVIL,MOTRIN) 600 MG tablet Take 1 tablet (600  mg total) by mouth every 8 (eight) hours as needed. 09/11/17   Merrily Brittleifenbark, Joquan Lotz, MD  Norethindrone Acetate-Ethinyl Estrad-FE (LOESTRIN 24 FE) 1-20 MG-MCG(24) tablet Take 1 tablet by mouth daily. 07/26/15   Farrel ConnersGutierrez, Colleen, CNM  ondansetron (ZOFRAN ODT) 4 MG disintegrating tablet Take 1 tablet (4 mg total) by mouth every 8 (eight) hours as needed for nausea or vomiting. 09/11/17   Merrily Brittleifenbark, Vanessa Alesi, MD  pantoprazole (PROTONIX) 20 MG tablet Take 1 tablet (20 mg total) by mouth daily. 05/13/15   Jennye MoccasinQuigley, Brian S, MD  Prenatal Vit-Fe Fumarate-FA (MULTIVITAMIN-PRENATAL) 27-0.8 MG TABS tablet Take 1 tablet by mouth daily at 12 noon.    [provider]    Allergies Patient has no known allergies.  No family history on file.  Social History Social History   Tobacco Use  . Smoking status: Former Smoker    Packs/day: 0.25    Types: Cigarettes    Last attempt to quit: 10/06/2014    Years since quitting: 2.9  Substance Use Topics  . Alcohol use: No  . Drug use: No    Review of Systems Constitutional: Positive for fevers Eyes: No visual changes. ENT: No sore throat. Cardiovascular: Denies chest pain. Respiratory: Positive for cough. Gastrointestinal: Positive for abdominal pain.  Positive for nausea, positive for vomiting.  No diarrhea.  No constipation. Genitourinary: Negative for dysuria. Musculoskeletal: Negative for back pain. Skin: Negative for rash. Neurological: Positive for headache   ____________________________________________   PHYSICAL EXAM:  VITAL SIGNS: ED Triage Vitals [09/11/17 1221]  Enc Vitals Group     BP 110/78     Pulse Rate 80     Resp  18     Temp 97.7 F (36.5 C)     Temp Source Oral     SpO2 99 %     Weight 119 lb (54 kg)     Height 5\' 2"  (1.575 m)     Head Circumference      Peak Flow      Pain Score 8     Pain Loc      Pain Edu?      Excl. in GC?     Constitutional: Alert and oriented x4 appears miserable diaphoretic uncomfortable  appearing flushed skin Eyes: PERRL EOMI. Head: Atraumatic. Nose: No congestion/rhinnorhea. Mouth/Throat: No trismus uvula midline no pharyngeal erythema or exudate no lymphadenopathy Neck: No stridor.   Cardiovascular: Normal rate, regular rhythm. Grossly normal heart sounds.  Good peripheral circulation. Respiratory: Increased respiratory effort.  No retractions. Lungs CTAB and moving good air Gastrointestinal: Soft nondistended nontender no rebound or guarding no peritonitis Musculoskeletal: No lower extremity edema   Neurologic:  Normal speech and language. No gross focal neurologic deficits are appreciated. Skin: Diaphoretic erythematous flush skin Psychiatric: Mood and affect are normal. Speech and behavior are normal.    ____________________________________________   DIFFERENTIAL includes but not limited to  Influenza, influenza-like illness, viral syndrome, pyelonephritis ____________________________________________   LABS (all labs ordered are listed, but only abnormal results are displayed)  Labs Reviewed  CBC WITH DIFFERENTIAL/PLATELET - Abnormal; Notable for the following components:      Result Value   WBC 14.4 (*)    RBC 5.84 (*)    Hemoglobin 17.6 (*)    HCT 51.6 (*)    Platelets 537 (*)    Neutro Abs 11.7 (*)    All other components within normal limits  COMPREHENSIVE METABOLIC PANEL - Abnormal; Notable for the following components:   Potassium 3.4 (*)    CO2 20 (*)    Glucose, Bld 124 (*)    Total Protein 8.5 (*)    All other components within normal limits  URINALYSIS, COMPLETE (UACMP) WITH MICROSCOPIC - Abnormal; Notable for the following components:   Color, Urine AMBER (*)    APPearance CLOUDY (*)    Hgb urine dipstick MODERATE (*)    Ketones, ur 20 (*)    Protein, ur 100 (*)    Bacteria, UA RARE (*)    Squamous Epithelial / LPF 6-30 (*)    All other components within normal limits  INFLUENZA PANEL BY PCR (TYPE A & B)  LIPASE, BLOOD  HCG,  QUANTITATIVE, PREGNANCY  POCT PREGNANCY, URINE    Lab work reviewed by me with slightly elevated white count which is nonspecific.  Hemoconcentration evident with elevated hemoglobin.  No evidence of urinary tract infection or influenza. __________________________________________  EKG   ____________________________________________  RADIOLOGY  Chest x-ray reviewed by me with no acute disease ____________________________________________   PROCEDURES  Procedure(s) performed: no  Procedures  Critical Care performed: no  Observation: no ____________________________________________   INITIAL IMPRESSION / ASSESSMENT AND PLAN / ED COURSE  Pertinent labs & imaging results that were available during my care of the patient were reviewed by me and considered in my medical decision making (see chart for details).  Patient arrives diaphoretic flushed and uncomfortable appearing.  I will begin with IV hydration as well as Tylenol Motrin and reevaluate.      ______________________----------------------------------------- 3:10 PM on 09/11/2017 -----------------------------------------  The patient feels significantly improved.  Flu negative chest x-ray normal blood work unremarkable aside from significant dehydration.  Her second  liter of fluids now while her urine is pending.  ______________________   Patient's urinalysis is negative for infection.  After 2 L she feels nearly completely improved.  She likely has viral syndrome contributing to significant dehydration.  I will treat her symptomatically with Fioricet as well as Motrin and encourage aggressive oral hydration.  Strict return precautions have been given to the patient verbalized understanding agree with plan.  FINAL CLINICAL IMPRESSION(S) / ED DIAGNOSES  Final diagnoses:  Influenza-like illness  Dehydration      NEW MEDICATIONS STARTED DURING THIS VISIT:  This SmartLink is deprecated. Use AVSMEDLIST instead to  display the medication list for a patient.   Note:  This document was prepared using Dragon voice recognition software and may include unintentional dictation errors.     Merrily Brittle, MD 09/12/17 1057

## 2017-09-11 NOTE — Discharge Instructions (Signed)
Fortunately today your chest x-ray, your flu test, and your blood tests were reassuring.  It is normal to be sick for up to a full 2 weeks with the infection that you have.  Please make sure you remain well-hydrated with lots of small sips of fluids and take your nausea and headache medication as needed for severe symptoms.  Follow-up with your primary care physician if you are not better by Monday and return to the emergency department for any concerns.  It was a pleasure to take care of you today, and thank you for coming to our emergency department.  If you have any questions or concerns before leaving please ask the nurse to grab me and I'm more than happy to go through your aftercare instructions again.  If you were prescribed any opioid pain medication today such as Norco, Vicodin, Percocet, morphine, hydrocodone, or oxycodone please make sure you do not drive when you are taking this medication as it can alter your ability to drive safely.  If you have any concerns once you are home that you are not improving or are in fact getting worse before you can make it to your follow-up appointment, please do not hesitate to call 911 and come back for further evaluation.  Merrily BrittleNeil Elmira Olkowski, MD  Results for orders placed or performed during the hospital encounter of 09/11/17  CBC with Differential  Result Value Ref Range   WBC 14.4 (H) 3.6 - 11.0 K/uL   RBC 5.84 (H) 3.80 - 5.20 MIL/uL   Hemoglobin 17.6 (H) 12.0 - 16.0 g/dL   HCT 40.951.6 (H) 81.135.0 - 91.447.0 %   MCV 88.3 80.0 - 100.0 fL   MCH 30.2 26.0 - 34.0 pg   MCHC 34.2 32.0 - 36.0 g/dL   RDW 78.213.0 95.611.5 - 21.314.5 %   Platelets 537 (H) 150 - 440 K/uL   Neutrophils Relative % 80 %   Neutro Abs 11.7 (H) 1.4 - 6.5 K/uL   Lymphocytes Relative 13 %   Lymphs Abs 1.9 1.0 - 3.6 K/uL   Monocytes Relative 5 %   Monocytes Absolute 0.7 0.2 - 0.9 K/uL   Eosinophils Relative 1 %   Eosinophils Absolute 0.1 0 - 0.7 K/uL   Basophils Relative 1 %   Basophils Absolute  0.1 0 - 0.1 K/uL  Comprehensive metabolic panel  Result Value Ref Range   Sodium 141 135 - 145 mmol/L   Potassium 3.4 (L) 3.5 - 5.1 mmol/L   Chloride 107 101 - 111 mmol/L   CO2 20 (L) 22 - 32 mmol/L   Glucose, Bld 124 (H) 65 - 99 mg/dL   BUN 18 6 - 20 mg/dL   Creatinine, Ser 0.860.91 0.44 - 1.00 mg/dL   Calcium 9.9 8.9 - 57.810.3 mg/dL   Total Protein 8.5 (H) 6.5 - 8.1 g/dL   Albumin 5.0 3.5 - 5.0 g/dL   AST 26 15 - 41 U/L   ALT 19 14 - 54 U/L   Alkaline Phosphatase 71 38 - 126 U/L   Total Bilirubin 0.7 0.3 - 1.2 mg/dL   GFR calc non Af Amer >60 >60 mL/min   GFR calc Af Amer >60 >60 mL/min   Anion gap 14 5 - 15  Influenza panel by PCR (type A & B)  Result Value Ref Range   Influenza A By PCR NEGATIVE NEGATIVE   Influenza B By PCR NEGATIVE NEGATIVE  Lipase, blood  Result Value Ref Range   Lipase 19 11 - 51 U/L  Urinalysis, Complete w Microscopic  Result Value Ref Range   Color, Urine AMBER (A) YELLOW   APPearance CLOUDY (A) CLEAR   Specific Gravity, Urine 1.029 1.005 - 1.030   pH 5.0 5.0 - 8.0   Glucose, UA NEGATIVE NEGATIVE mg/dL   Hgb urine dipstick MODERATE (A) NEGATIVE   Bilirubin Urine NEGATIVE NEGATIVE   Ketones, ur 20 (A) NEGATIVE mg/dL   Protein, ur 161 (A) NEGATIVE mg/dL   Nitrite NEGATIVE NEGATIVE   Leukocytes, UA NEGATIVE NEGATIVE   RBC / HPF 0-5 0 - 5 RBC/hpf   WBC, UA 0-5 0 - 5 WBC/hpf   Bacteria, UA RARE (A) NONE SEEN   Squamous Epithelial / LPF 6-30 (A) NONE SEEN   Mucus PRESENT   hCG, quantitative, pregnancy  Result Value Ref Range   hCG, Beta Chain, Quant, S <1 <5 mIU/mL  Pregnancy, urine POC  Result Value Ref Range   Preg Test, Ur NEGATIVE NEGATIVE   Dg Chest 2 View  Result Date: 09/11/2017 CLINICAL DATA:  Cough and fever for 3 weeks. EXAM: CHEST  2 VIEW COMPARISON:  None. FINDINGS: Normal heart size and mediastinal contours. No acute infiltrate or edema. No effusion or pneumothorax. No acute osseous findings. IMPRESSION: Negative chest. Electronically  Signed   By: Marnee Spring M.D.   On: 09/11/2017 13:57

## 2021-08-30 ENCOUNTER — Observation Stay
Admission: EM | Admit: 2021-08-30 | Discharge: 2021-08-30 | Disposition: A | Discharge status: Home / Self Care | Payer: Commercial Managed Care - PPO | Attending: Family Medicine | Admitting: Family Medicine

## 2021-08-30 ENCOUNTER — Other Ambulatory Visit: Payer: Self-pay

## 2021-08-30 ENCOUNTER — Emergency Department: Payer: Commercial Managed Care - PPO

## 2021-08-30 ENCOUNTER — Encounter: Payer: Self-pay | Admitting: Emergency Medicine

## 2021-08-30 DIAGNOSIS — L0291 Cutaneous abscess, unspecified: Secondary | ICD-10-CM

## 2021-08-30 DIAGNOSIS — N63 Unspecified lump in unspecified breast: Secondary | ICD-10-CM

## 2021-08-30 DIAGNOSIS — N611 Abscess of the breast and nipple: Secondary | ICD-10-CM | POA: Diagnosis not present

## 2021-08-30 DIAGNOSIS — Z20822 Contact with and (suspected) exposure to covid-19: Secondary | ICD-10-CM | POA: Insufficient documentation

## 2021-08-30 DIAGNOSIS — E876 Hypokalemia: Secondary | ICD-10-CM | POA: Diagnosis not present

## 2021-08-30 DIAGNOSIS — Z87891 Personal history of nicotine dependence: Secondary | ICD-10-CM | POA: Diagnosis not present

## 2021-08-30 DIAGNOSIS — N644 Mastodynia: Secondary | ICD-10-CM | POA: Diagnosis present

## 2021-08-30 HISTORY — DX: Pure hypercholesterolemia, unspecified: E78.00

## 2021-08-30 HISTORY — DX: Psoriasis, unspecified: L40.9

## 2021-08-30 HISTORY — DX: Tension-type headache, unspecified, not intractable: G44.209

## 2021-08-30 HISTORY — DX: Hypokalemia: E87.6

## 2021-08-30 HISTORY — DX: Anxiety disorder, unspecified: F41.9

## 2021-08-30 LAB — MAGNESIUM: Magnesium: 2.4 mg/dL (ref 1.7–2.4)

## 2021-08-30 LAB — CBC
HCT: 45.4 % (ref 36.0–46.0)
Hemoglobin: 16.3 g/dL — ABNORMAL HIGH (ref 12.0–15.0)
MCH: 34.1 pg — ABNORMAL HIGH (ref 26.0–34.0)
MCHC: 35.9 g/dL (ref 30.0–36.0)
MCV: 95 fL (ref 80.0–100.0)
Platelets: 451 10*3/uL — ABNORMAL HIGH (ref 150–400)
RBC: 4.78 MIL/uL (ref 3.87–5.11)
RDW: 13.9 % (ref 11.5–15.5)
WBC: 10.2 10*3/uL (ref 4.0–10.5)
nRBC: 0 % (ref 0.0–0.2)

## 2021-08-30 LAB — COMPREHENSIVE METABOLIC PANEL
ALT: 38 U/L (ref 0–44)
AST: 27 U/L (ref 15–41)
Albumin: 4.1 g/dL (ref 3.5–5.0)
Alkaline Phosphatase: 121 U/L (ref 38–126)
Anion gap: 9 (ref 5–15)
BUN: 6 mg/dL (ref 6–20)
CO2: 30 mmol/L (ref 22–32)
Calcium: 9.4 mg/dL (ref 8.9–10.3)
Chloride: 97 mmol/L — ABNORMAL LOW (ref 98–111)
Creatinine, Ser: 0.57 mg/dL (ref 0.44–1.00)
GFR, Estimated: >60 mL/min (ref >60)
Glucose, Bld: 111 mg/dL — ABNORMAL HIGH (ref 70–99)
Potassium: 2.3 mmol/L — CL (ref 3.5–5.1)
Sodium: 136 mmol/L (ref 135–145)
Total Bilirubin: 0.8 mg/dL (ref 0.3–1.2)
Total Protein: 7.8 g/dL (ref 6.5–8.1)

## 2021-08-30 LAB — POC URINE PREG, ED: Preg Test, Ur: NEGATIVE

## 2021-08-30 LAB — RESP PANEL BY RT-PCR (FLU A&B, COVID) ARPGX2
Influenza A by PCR: NEGATIVE
Influenza B by PCR: NEGATIVE
SARS Coronavirus 2 by RT PCR: NEGATIVE

## 2021-08-30 LAB — POTASSIUM: Potassium: 2.4 mmol/L — CL (ref 3.5–5.1)

## 2021-08-30 MED ORDER — ONDANSETRON HCL 4 MG PO TABS: 4.0000 mg | ORAL_TABLET | Freq: Three times a day (TID) | ORAL | 0 refills | Status: AC | PRN

## 2021-08-30 MED ORDER — CLINDAMYCIN PHOSPHATE 300 MG/50ML IV SOLN
300.0000 mg | Freq: Once | INTRAVENOUS | Status: AC
  Administered 2021-08-30: 16:00:00 300 mg via INTRAVENOUS
  Filled 2021-08-30: qty 50

## 2021-08-30 MED ORDER — SULFAMETHOXAZOLE-TRIMETHOPRIM 800-160 MG PO TABS
1.0000 | ORAL_TABLET | Freq: Once | ORAL | Status: DC
  Filled 2021-08-30: qty 1

## 2021-08-30 MED ORDER — SODIUM CHLORIDE 0.9 % IV SOLN
1.0000 g | Freq: Once | INTRAVENOUS | Status: AC
  Administered 2021-08-30: 14:00:00 1 g via INTRAVENOUS
  Filled 2021-08-30: qty 10

## 2021-08-30 MED ORDER — METOCLOPRAMIDE HCL 5 MG/ML IJ SOLN
10.0000 mg | Freq: Once | INTRAMUSCULAR | Status: AC
  Administered 2021-08-30: 14:00:00 10 mg via INTRAVENOUS
  Filled 2021-08-30: qty 2

## 2021-08-30 MED ORDER — ACETAMINOPHEN 325 MG PO TABS: 650.0000 mg | ORAL_TABLET | Freq: Four times a day (QID) | ORAL | Status: DC | PRN

## 2021-08-30 MED ORDER — PANTOPRAZOLE SODIUM 20 MG PO TBEC
20.0000 mg | DELAYED_RELEASE_TABLET | Freq: Every day | ORAL | Status: DC
  Filled 2021-08-30: qty 1

## 2021-08-30 MED ORDER — HYDROMORPHONE HCL 1 MG/ML IJ SOLN
0.5000 mg | Freq: Once | INTRAMUSCULAR | Status: AC
  Administered 2021-08-30: 11:00:00 0.5 mg via INTRAVENOUS
  Filled 2021-08-30: qty 1

## 2021-08-30 MED ORDER — OXYCODONE HCL 5 MG PO TABS: 5.0000 mg | ORAL_TABLET | Freq: Three times a day (TID) | ORAL | 0 refills | Status: DC | PRN

## 2021-08-30 MED ORDER — ONDANSETRON HCL 4 MG PO TABS: 4.0000 mg | ORAL_TABLET | Freq: Four times a day (QID) | ORAL | Status: DC | PRN

## 2021-08-30 MED ORDER — SULFAMETHOXAZOLE-TRIMETHOPRIM 800-160 MG PO TABS: 1.0000 | ORAL_TABLET | Freq: Two times a day (BID) | ORAL | 0 refills | Status: AC

## 2021-08-30 MED ORDER — ONDANSETRON HCL 4 MG/2ML IJ SOLN: 4.0000 mg | Freq: Four times a day (QID) | INTRAMUSCULAR | Status: DC | PRN

## 2021-08-30 MED ORDER — POTASSIUM CHLORIDE 10 MEQ/100ML IV SOLN
10.0000 meq | INTRAVENOUS | Status: AC
  Administered 2021-08-30 (×3): 10 meq via INTRAVENOUS
  Filled 2021-08-30 (×4): qty 100

## 2021-08-30 MED ORDER — ACETAMINOPHEN 650 MG RE SUPP: 650.0000 mg | Freq: Four times a day (QID) | RECTAL | Status: DC | PRN

## 2021-08-30 MED ORDER — POTASSIUM CHLORIDE 20 MEQ PO PACK
40.0000 meq | PACK | Freq: Once | ORAL | Status: AC
  Administered 2021-08-30: 17:00:00 40 meq via ORAL
  Filled 2021-08-30: qty 2

## 2021-08-30 MED ORDER — POTASSIUM CHLORIDE 10 MEQ/100ML IV SOLN
10.0000 meq | Freq: Once | INTRAVENOUS | Status: AC
  Administered 2021-08-30: 14:00:00 10 meq via INTRAVENOUS

## 2021-08-30 MED ORDER — FLUOXETINE HCL 20 MG PO CAPS: 20.0000 mg | ORAL_CAPSULE | Freq: Every day | ORAL | Status: DC

## 2021-08-30 MED ORDER — FLUOXETINE HCL 20 MG PO CAPS
30.0000 mg | ORAL_CAPSULE | Freq: Every day | ORAL | Status: DC
  Filled 2021-08-30: qty 1

## 2021-08-30 MED ORDER — MORPHINE SULFATE (PF) 2 MG/ML IV SOLN: 2.0000 mg | INTRAVENOUS | Status: DC | PRN

## 2021-08-30 MED ORDER — SULFAMETHOXAZOLE-TRIMETHOPRIM 800-160 MG PO TABS: 1.0000 | ORAL_TABLET | Freq: Two times a day (BID) | ORAL | Status: DC

## 2021-08-30 MED ORDER — ONDANSETRON HCL 4 MG/2ML IJ SOLN
4.0000 mg | Freq: Once | INTRAMUSCULAR | Status: AC
  Administered 2021-08-30: 11:00:00 4 mg via INTRAVENOUS
  Filled 2021-08-30: qty 2

## 2021-08-30 MED ORDER — SODIUM CHLORIDE 0.9 % IV BOLUS
1000.0000 mL | Freq: Once | INTRAVENOUS | Status: AC
  Administered 2021-08-30: 14:00:00 1000 mL via INTRAVENOUS

## 2021-08-30 MED ORDER — LIDOCAINE HCL 1 % IJ SOLN
10.0000 mL | Freq: Once | INTRAMUSCULAR | Status: AC
  Administered 2021-08-30: 16:00:00 10 mL via INTRADERMAL
  Filled 2021-08-30: qty 10

## 2021-08-30 MED ORDER — FLUOXETINE HCL 10 MG PO CAPS
10.0000 mg | ORAL_CAPSULE | Freq: Every day | ORAL | Status: DC
  Filled 2021-08-30: qty 1

## 2021-08-30 MED ORDER — POTASSIUM CHLORIDE CRYS ER 20 MEQ PO TBCR
40.0000 meq | EXTENDED_RELEASE_TABLET | Freq: Once | ORAL | Status: AC
  Administered 2021-08-30: 13:00:00 40 meq via ORAL
  Filled 2021-08-30: qty 2

## 2021-08-30 MED ORDER — HYDROMORPHONE HCL 1 MG/ML IJ SOLN: 1.0000 mg | Freq: Once | INTRAMUSCULAR | Status: DC

## 2021-08-30 MED ORDER — BUPROPION HCL ER (XL) 150 MG PO TB24: 150.0000 mg | ORAL_TABLET | Freq: Every day | ORAL | Status: DC

## 2021-08-30 MED ORDER — OXYCODONE-ACETAMINOPHEN 5-325 MG PO TABS
1.0000 | ORAL_TABLET | Freq: Once | ORAL | Status: AC
  Administered 2021-08-30: 08:00:00 1 via ORAL
  Filled 2021-08-30: qty 1

## 2021-08-30 NOTE — Progress Notes (Signed)
Pharmacy Antibiotic Note  Bethany Elliott is a 35 y.o. female admitted on 08/30/2021 with right breast abscess. Drainage completed 12/26 and cultures sent. Pharmacy has been consulted for Bactrim DS dosing per recs from general surgery.  Plan: Bactrim DS - 1 tablet BID for 10 days Monitor renal function and adjust dose as clinically indicated Follow up cultures  Height: 5\' 2"  (157.5 cm) Weight: 54 kg (119 lb 0.8 oz) IBW/kg (Calculated) : 50.1  Temp (24hrs), Avg:97.8 F (36.6 C), Min:97.8 F (36.6 C), Max:97.8 F (36.6 C)  Recent Labs  Lab 08/30/21 0808 08/30/21 1213  WBC 10.2  --   CREATININE  --  0.57    Estimated Creatinine Clearance: 79.1 mL/min (by C-G formula based on SCr of 0.57 mg/dL).    No Known Allergies  Antimicrobials this admission: 08/30/21 ceftriaxone and clindamycin x 1 08/30/21 Bactrim DS >> 09/08/21   Microbiology results: 12/26 Abscess from breast: sent  Thank you for allowing pharmacy to be a part of this patients care.  1/27 Bijan Ridgley 08/30/2021 2:41 PM

## 2021-08-30 NOTE — H&P (Signed)
H&P:    Bethany Elliott   DQQ:[id number] DOB: 1986-01-25 DOA: 08/30/2021  PCP: Kandyce Rud, MD  Chief Complaint: Right breast swelling   History of Present Illness:    HPI: Bethany Elliott is a 35 y.o. female with a past medical history of anxiety.  This patient presents to the emergency department with right breast swelling.  Started as a small pimple sized area on the right breast approximately 4 weeks ago but has increased in size.  No fevers.  Does have some chills. She is now status post bedside I&D by general surgery.  Endorses nausea and vomiting.  But she states this is a longstanding issue for her.  She has lost approximately 20 pounds in the past 4 to 5 months.  Has had anxiety since she was little but worsened over the last 5 years since her father passed away.  Denies any SI.  States that she does not eat that much because of her anxiety.  Endorses generalized abdominal pain and heartburn as well.  No recent sickness.  No sick contacts.  No diarrhea.  States she is constipated.  Denies any urinary issues.  Nothing makes her abdominal pain better.  Nothing makes it worse.  Yesterday she had no episodes of vomiting.  The day prior she had a few.  1 episode of vomiting today as well.  Nonbloody nonbilious emesis.  No chest pain.  No shortness of breath.  ED Course: Chest ultrasound demonstrated a 4 x 2 x 2 complex fluid collection.  Given broad-spectrum antibiotics with Rocephin and clindamycin.  Given IV fluid bolus with normal saline 1 L.  Potassium replacement started with Klor-Con and KCl IV.  Given Dilaudid and Percocet for pain control and Reglan and Zofran for nausea.  Lab work showed severe hypokalemia with potassium level 2.3.  Mag level normal.  Other labs unremarkable.   ROS:   14 point review of systems is negative except for what is mentioned above in the HPI.   Past Medical History:   Past Medical History:  Diagnosis Date   Anxiety     Hypercholesterolemia    Hypokalemia    Psoriasis    Tension headache     Past Surgical History:   History reviewed. No pertinent surgical history.  Social History:   Social History   Socioeconomic History   Marital status: Married    Spouse name: Not on file   Number of children: Not on file   Years of education: Not on file   Highest education level: Not on file  Occupational History   Not on file  Tobacco Use   Smoking status: Former    Packs/day: 0.25    Types: Cigarettes    Quit date: 10/06/2014    Years since quitting: 6.9   Smokeless tobacco: Not on file  Substance and Sexual Activity   Alcohol use: No   Drug use: No   Sexual activity: Yes  Other Topics Concern   Not on file  Social History Narrative   Not on file   Social Determinants of Health   Financial Resource Strain: Not on file  Food Insecurity: Not on file  Transportation Needs: Not on file  Physical Activity: Not on file  Stress: Not on file  Social Connections: Not on file  Intimate Partner Violence: Not on file    Allergies:   No Known Allergies  Family History:   No family history on file.   Current Medications:   Prior to  Admission medications   Medication Sig Start Date End Date Taking? Authorizing Provider  FLUoxetine (PROZAC) 10 MG capsule Take 1 capsule by mouth daily. With 20 mg cap= 30mg  08/05/21 08/05/22 Yes [provider]  levonorgestrel-ethinyl estradiol (SEASONALE) 0.15-0.03 MG tablet Take 1 tablet by mouth daily. 11/26/20  Yes [provider]  ondansetron (ZOFRAN) 4 MG tablet Take 1 tablet (4 mg total) by mouth every 8 (eight) hours as needed for nausea or vomiting. 08/30/21  Yes 09/01/21, MD  oxyCODONE (ROXICODONE) 5 MG immediate release tablet Take 1 tablet (5 mg total) by mouth every 8 (eight) hours as needed. 08/30/21 08/30/22 Yes 09/01/22, MD  sulfamethoxazole-trimethoprim (BACTRIM DS) 800-160 MG tablet Take 1 tablet by mouth 2 (two)  times daily for 10 days. 08/30/21 09/09/21 Yes 11/07/21, MD  benzocaine-Menthol (DERMOPLAST) 20-0.5 % AERO Apply 1 application topically as needed for irritation (perineal discomfort). 07/26/15   07/28/15, CNM  buPROPion (WELLBUTRIN XL) 150 MG 24 hr tablet Take 150 mg by mouth daily. 07/30/21   [provider]  FLUoxetine (PROZAC) 20 MG capsule Take 20 mg by mouth daily. With 10 mg cap=30 mg 08/07/21   [provider]  ibuprofen (ADVIL,MOTRIN) 600 MG tablet Take 1 tablet (600 mg total) by mouth every 8 (eight) hours as needed. 09/11/17   11/09/17, MD  KLOR-CON M20 20 MEQ tablet Take 20 mEq by mouth 2 (two) times daily. 07/28/21   [provider]  ondansetron (ZOFRAN ODT) 4 MG disintegrating tablet Take 1 tablet (4 mg total) by mouth every 8 (eight) hours as needed for nausea or vomiting. Patient not taking: Reported on 08/30/2021 09/11/17   11/09/17, MD  pantoprazole (PROTONIX) 20 MG tablet Take 1 tablet (20 mg total) by mouth daily. Patient not taking: Reported on 08/30/2021 05/13/15   07/13/15, MD  Prenatal Vit-Fe Fumarate-FA (MULTIVITAMIN-PRENATAL) 27-0.8 MG TABS tablet Take 1 tablet by mouth daily at 12 noon.    [provider]     Physical Exam:   Vitals:   08/30/21 09/01/21 08/30/21 0856 08/30/21 1106  BP:  108/64 106/69  Pulse:  60 60  Resp:  16 18  Temp:  97.8 F (36.6 C)   TempSrc:  Oral   SpO2:  98% 98%  Weight: 54 kg    Height: 5\' 2"  (1.575 m)       General:  Appears calm and comfortable and is in NAD right breast dressing clean dry and intact Cardiovascular:  RRR, no m/r/g.  Respiratory:   CTA bilaterally with no wheezes/rales/rhonchi.  Normal respiratory effort. Abdomen:  soft, NT, ND, NABS, no rebound, no guarding no peritoneal signs. Skin:  no rash or induration seen on limited exam Musculoskeletal:  grossly normal tone BUE/BLE, good ROM, no bony abnormality Lower extremity:  No LE edema.  Limited foot  exam with no ulcerations.  2+ distal pulses. Psychiatric:  grossly normal mood and affect, speech fluent and appropriate, AOx3 Neurologic:  CN 2-12 grossly intact, moves all extremities in coordinated fashion, sensation intact.  Appears anxious.    Data Review:    Radiological Exams on Admission: Independently reviewed - see discussion in A/P where applicable  09/01/21 CHEST SOFT TISSUE  Result Date: 08/30/2021 CLINICAL DATA:  Right breast swelling EXAM: ULTRASOUND OF CHEST SOFT TISSUES TECHNIQUE: Ultrasound examination of the chest wall soft tissues was performed in the area of clinical concern. COMPARISON:  None. FINDINGS: There is 4 x 2.1 x 2.8 cm smooth marginated mixed  echogenic lesion in the subcutaneous plane at 2 o'clock position in the right breast 1 cm from the nipple. Differential diagnostic possibilities would include inflammatory phlegmon or early abscess or neoplastic process. There is demonstrable internal vascularity in the color Doppler examination. There is no definite demonstrable thick-walled loculated fluid collection in the area of patient's symptoms. IMPRESSION: There is 4 x 2.1 x 2.8 cm mixed echogenic lesion with smooth margins in the area of patient's symptoms at 2 o'clock position 1 cm from the nipple in the subcutaneous plane. This may suggest inflammatory phlegmon or early abscess or neoplastic process. Electronically Signed   By: Ernie Avena M.D.   On: 08/30/2021 09:16     Labs on Admission: I have personally reviewed the available labs and imaging studies at the time of the admission.  Pertinent labs on Admission: WBC 10.2, hemoglobin 16, potassium 2.3     Assessment/Plan:    Right breast abscess: Status post bedside I&D by general surgery.  Treated initially with Rocephin and clindamycin.  General surgery recommends 10 days of Bactrim DS.  This has been started.  Will need follow-up with general surgery upon discharge.  Cultures have been sent  off.  Severe hypokalemia: Started on Klor-Con 10 mEq and KCl 10 mEq IV x4 doses. Will add additional Klor-Con 40 mEq x1 dose. Repeat BMP in the morning.  Intractable nausea and vomiting with generalized abdominal pain: Longstanding.  Abdominal exam is completely benign. States that she does not eat much due to her anxiety.  I feel that her symptomatology is related to her severe uncontrolled anxiety.  Recommend referral to psychiatry upon discharge.  GERD: Start PPI with Protonix 20 mg daily  Anxiety: Continue home Wellbutrin and fluoxetine   Other information:    Level of Care: MedSurg DVT prophylaxis: SCDs Code Status: Full code Consults: General surgery Admission status: Observation   Verdia Kuba DO [facility]   How to contact the [facility] Attending or Consulting provider 7A - 7P or covering provider during after hours 7P -7A, for this patient?  Check the care team in [facility] and look for a) attending/consulting [facility] provider listed and b) the [facility] team listed Log into [email or web address] and use [place]'s universal password to access. If you do not have the password, please contact the hospital operator. Locate the [facility] provider you are looking for under [facility] and page to a number that you can be directly reached. If you still have difficulty reaching the provider, please page the [facility] (Director on Call) for the Hospitalists listed on amion for assistance.   08/30/2021, 2:33 PM

## 2021-08-30 NOTE — ED Provider Notes (Signed)
[facility] Emergency Department Provider Note    Event Date/Time   First MD Initiated Contact with Patient 08/30/21 [phone]     (approximate)  I have reviewed the triage vital signs and the nursing notes.   HISTORY  Chief Complaint Abscess (/)    HPI Bethany Elliott is a 35 y.o. female no significant past medical history presents to the ER for evaluation of right breast pain and swelling that is popped up over the past 2 weeks.  Having worsening pain and discomfort today.  No measured fevers no chills.  Denies any history of mass or abscess in the past not currently on antibiotics.  Denies any chance of being pregnant.  She not currently breast-feeding.  Past Medical History:  Diagnosis Date   Anxiety    Hypercholesterolemia    Hypokalemia    Psoriasis    Tension headache    No family history on file. History reviewed. No pertinent surgical history. Patient Active Problem List   Diagnosis Date Noted   Hypokalemia 08/30/2021   Abscess of right breast    Rh negative, delivered, current hospitalization 07/25/2015   Leukocytosis 07/25/2015   Postpartum care following vaginal delivery 07/24/2015      Prior to Admission medications   Medication Sig Start Date End Date Taking? Authorizing Provider  FLUoxetine (PROZAC) 10 MG capsule Take 1 capsule by mouth daily. With 20 mg cap= 30mg  08/05/21 08/05/22 Yes [provider]  levonorgestrel-ethinyl estradiol (SEASONALE) 0.15-0.03 MG tablet Take 1 tablet by mouth daily. 11/26/20  Yes [provider]  ondansetron (ZOFRAN) 4 MG tablet Take 1 tablet (4 mg total) by mouth every 8 (eight) hours as needed for nausea or vomiting. 08/30/21  Yes 09/01/21, MD  oxyCODONE (ROXICODONE) 5 MG immediate release tablet Take 1 tablet (5 mg total) by mouth every 8 (eight) hours as needed. 08/30/21 08/30/22 Yes 09/01/22, MD  sulfamethoxazole-trimethoprim (BACTRIM DS) 800-160 MG tablet Take 1  tablet by mouth 2 (two) times daily for 10 days. 08/30/21 09/09/21 Yes 11/07/21, MD  benzocaine-Menthol (DERMOPLAST) 20-0.5 % AERO Apply 1 application topically as needed for irritation (perineal discomfort). 07/26/15   07/28/15, CNM  buPROPion (WELLBUTRIN XL) 150 MG 24 hr tablet Take 150 mg by mouth daily. 07/30/21   [provider]  FLUoxetine (PROZAC) 20 MG capsule Take 20 mg by mouth daily. With 10 mg cap=30 mg 08/07/21   [provider]  ibuprofen (ADVIL,MOTRIN) 600 MG tablet Take 1 tablet (600 mg total) by mouth every 8 (eight) hours as needed. 09/11/17   11/09/17, MD  KLOR-CON M20 20 MEQ tablet Take 20 mEq by mouth 2 (two) times daily. 07/28/21   [provider]  ondansetron (ZOFRAN ODT) 4 MG disintegrating tablet Take 1 tablet (4 mg total) by mouth every 8 (eight) hours as needed for nausea or vomiting. Patient not taking: Reported on 08/30/2021 09/11/17   11/09/17, MD  pantoprazole (PROTONIX) 20 MG tablet Take 1 tablet (20 mg total) by mouth daily. Patient not taking: Reported on 08/30/2021 05/13/15   07/13/15, MD  Prenatal Vit-Fe Fumarate-FA (MULTIVITAMIN-PRENATAL) 27-0.8 MG TABS tablet Take 1 tablet by mouth daily at 12 noon.    [provider]    Allergies Patient has no known allergies.    Social History Social History   Tobacco Use   Smoking status: Former    Packs/day: 0.25    Types: Cigarettes    Quit date: 10/06/2014  Years since quitting: 6.9  Substance Use Topics   Alcohol use: No   Drug use: No    Review of Systems Patient denies headaches, rhinorrhea, blurry vision, numbness, shortness of breath, chest pain, edema, cough, abdominal pain, nausea, vomiting, diarrhea, dysuria, fevers, rashes or hallucinations unless otherwise stated above in HPI. ____________________________________________   PHYSICAL EXAM:  VITAL SIGNS: Vitals:   08/30/21 0856 08/30/21 1106  BP: 108/64 106/69  Pulse: 60  60  Resp: 16 18  Temp: 97.8 F (36.6 C)   SpO2: 98% 98%    Constitutional: Alert and oriented.  Eyes: Conjunctivae are normal.  Head: Atraumatic. Nose: No congestion/rhinnorhea. Mouth/Throat: Mucous membranes are moist.   Neck: No stridor. Painless ROM.  Cardiovascular: Normal rate, regular rhythm. Grossly normal heart sounds.  Good peripheral circulation. Respiratory: Normal respiratory effort.  No retractions. Lungs CTAB. Gastrointestinal: Soft and nontender. No distention. No abdominal bruits. No CVA tenderness. Genitourinary:  Musculoskeletal: No lower extremity tenderness nor edema.  No joint effusions. Neurologic:  Normal speech and language. No gross focal neurologic deficits are appreciated. No facial droop Skin:  Skin is warm, dry and intact. No rash noted. Psychiatric: Mood and affect are normal. Speech and behavior are normal.  ____________________________________________   LABS (all labs ordered are listed, but only abnormal results are displayed)  Results for orders placed or performed during the hospital encounter of 08/30/21 (from the past 24 hour(s))  CBC     Status: Abnormal   Collection Time: 08/30/21  8:08 AM  Result Value Ref Range   WBC 10.2 4.0 - 10.5 K/uL   RBC 4.78 3.87 - 5.11 MIL/uL   Hemoglobin 16.3 (H) 12.0 - 15.0 g/dL   HCT 31.5 17.6 - 16.0 %   MCV 95.0 80.0 - 100.0 fL   MCH 34.1 (H) 26.0 - 34.0 pg   MCHC 35.9 30.0 - 36.0 g/dL   RDW 73.7 10.6 - 26.9 %   Platelets 451 (H) 150 - 400 K/uL   nRBC 0.0 0.0 - 0.2 %  POC Urine Pregnancy, ED     Status: None   Collection Time: 08/30/21 11:36 AM  Result Value Ref Range   Preg Test, Ur NEGATIVE NEGATIVE  Comprehensive metabolic panel     Status: Abnormal   Collection Time: 08/30/21 12:13 PM  Result Value Ref Range   Sodium 136 135 - 145 mmol/L   Potassium 2.3 (LL) 3.5 - 5.1 mmol/L   Chloride 97 (L) 98 - 111 mmol/L   CO2 30 22 - 32 mmol/L   Glucose, Bld 111 (H) 70 - 99 mg/dL   BUN 6 6 - 20 mg/dL    Creatinine, Ser 4.85 0.44 - 1.00 mg/dL   Calcium 9.4 8.9 - 46.2 mg/dL   Total Protein 7.8 6.5 - 8.1 g/dL   Albumin 4.1 3.5 - 5.0 g/dL   AST 27 15 - 41 U/L   ALT 38 0 - 44 U/L   Alkaline Phosphatase 121 38 - 126 U/L   Total Bilirubin 0.8 0.3 - 1.2 mg/dL   GFR, Estimated >70 >35 mL/min   Anion gap 9 5 - 15  Potassium     Status: Abnormal   Collection Time: 08/30/21 12:50 PM  Result Value Ref Range   Potassium 2.4 (LL) 3.5 - 5.1 mmol/L  Magnesium     Status: None   Collection Time: 08/30/21 12:50 PM  Result Value Ref Range   Magnesium 2.4 1.7 - 2.4 mg/dL   ____________________________________________ ____________________________________________  RADIOLOGY  I  personally reviewed all radiographic images ordered to evaluate for the above acute complaints and reviewed radiology reports and findings.  These findings were personally discussed with the patient.  Please see medical record for radiology report.  ____________________________________________   PROCEDURES  Procedure(s) performed:  Procedures    Critical Care performed: no ____________________________________________   INITIAL IMPRESSION / ASSESSMENT AND PLAN / ED COURSE  Pertinent labs & imaging results that were available during my care of the patient were reviewed by me and considered in my medical decision making (see chart for details).   DDX: abscess, cellulitis, mass, dehydration, sepsis  Bethany Elliott is a 35 y.o. who presents to the ED with symptoms as described above.  Patient is AFVSS in ED. Exam as above. Given current presentation have considered the above differential.  [place] ordered for the above differential.   Clinical Course as of 08/30/21 1504  Mon Aug 30, 2021  0956 Ultrasound consistent with abscess.  I consulted Dr. Aleen Campi of general surgery who will evaluate patient at bedside. [PR]  1301 Patient's potassium is critically low.  Will repeat as may be lab error she not reporting any vomiting.  [PR]  1402 Repeat lab confirms critically low potassium.  Patient still nauseous vomiting.  My concern is she is not going to tolerate antibiotics well potassium is too low for oral & IV repletion here in the ER.  Will discuss with hospitalist for admission for IV fluids as well as IV potassium.  Will give dose of Rocephin as well as clinda to treat cellulitis with abscess. [PR]    Clinical Course User Index [PR] Willy Eddy, MD    The patient was evaluated in Emergency Department today for the symptoms described in the history of present illness. He/she was evaluated in the context of the global COVID-19 pandemic, which necessitated consideration that the patient might be at risk for infection with the SARS-CoV-2 virus that causes COVID-19. Institutional protocols and algorithms that pertain to the evaluation of patients at risk for COVID-19 are in a state of rapid change based on information released by regulatory bodies including the CDC and federal and state organizations. These policies and algorithms were followed during the patient's care in the ED.  As part of my medical decision making, I reviewed the following data within the electronic MEDICAL RECORD NUMBER Nursing notes reviewed and incorporated, Labs reviewed, notes from prior ED visits and [place] Controlled Substance Database   ____________________________________________   FINAL CLINICAL IMPRESSION(S) / ED DIAGNOSES  Final diagnoses:  Breast swelling  Abscess  Hypokalemia      NEW MEDICATIONS STARTED DURING THIS VISIT:  New Prescriptions   ONDANSETRON (ZOFRAN) 4 MG TABLET    Take 1 tablet (4 mg total) by mouth every 8 (eight) hours as needed for nausea or vomiting.   OXYCODONE (ROXICODONE) 5 MG IMMEDIATE RELEASE TABLET    Take 1 tablet (5 mg total) by mouth every 8 (eight) hours as needed.   SULFAMETHOXAZOLE-TRIMETHOPRIM (BACTRIM DS) 800-160 MG TABLET    Take 1 tablet by mouth 2 (two) times daily for 10 days.     Note:   This document was prepared using Dragon voice recognition software and may include unintentional dictation errors.    Willy Eddy, MD 08/30/21 1504

## 2021-08-30 NOTE — ED Notes (Addendum)
Dr. Aleen Campi in to eval and drain abscess. Consent for signed, family at bedside.

## 2021-08-30 NOTE — Consult Note (Signed)
Date of Consultation:  08/30/2021  Requesting Physician: Willy Eddy, MD  Reason for Consultation: Right breast abscess  History of Present Illness: Bethany Elliott is a 35 y.o. female presenting with a 2-week history of worsening right breast pain.  The patient reports that about 2 weeks ago this all started with a very small pimple size area in the superior medial portion of the right areola.  This has been fluctuating with symptoms or size over the last couple weeks but overall worsening and becoming bigger.  Particularly yesterday, she has noticed that this area has worsened in size and feels more pressure inside and feels that it is about to burst.  She denies any fevers or chills at home.  She does report itching in the right breast.  Denies any drainage.  She has been trying warm compresses at home.  In the emergency room, she had laboratory work-up showing a WBC of 10.2 and ultrasound showing a 4 x 2.1 x 2.8 cm complex collection at the 12 o'clock position 1 cm from the nipple suggesting possible abscess.  Past Medical History: Past Medical History:  Diagnosis Date   Anxiety    Hypercholesterolemia    Hypokalemia    Psoriasis    Tension headache      Past Surgical History: History reviewed. No pertinent surgical history.  Home Medications: Prior to Admission medications   Medication Sig Start Date End Date Taking? Authorizing Provider  benzocaine-Menthol (DERMOPLAST) 20-0.5 % AERO Apply 1 application topically as needed for irritation (perineal discomfort). 07/26/15   Farrel Conners, CNM  ibuprofen (ADVIL,MOTRIN) 600 MG tablet Take 1 tablet (600 mg total) by mouth every 6 (six) hours as needed. 07/26/15   Farrel Conners, CNM  ibuprofen (ADVIL,MOTRIN) 600 MG tablet Take 1 tablet (600 mg total) by mouth every 8 (eight) hours as needed. 09/11/17   Merrily Brittle, MD  Norethindrone Acetate-Ethinyl Estrad-FE (LOESTRIN 24 FE) 1-20 MG-MCG(24) tablet Take 1 tablet by  mouth daily. 07/26/15   Farrel Conners, CNM  ondansetron (ZOFRAN ODT) 4 MG disintegrating tablet Take 1 tablet (4 mg total) by mouth every 8 (eight) hours as needed for nausea or vomiting. 09/11/17   Merrily Brittle, MD  pantoprazole (PROTONIX) 20 MG tablet Take 1 tablet (20 mg total) by mouth daily. 05/13/15   Jennye Moccasin, MD  Prenatal Vit-Fe Fumarate-FA (MULTIVITAMIN-PRENATAL) 27-0.8 MG TABS tablet Take 1 tablet by mouth daily at 12 noon.    [provider]    Allergies: No Known Allergies  Social History:  reports that she quit smoking about 6 years ago. Her smoking use included cigarettes. She smoked an average of .25 packs per day. She does not have any smokeless tobacco history on file. She reports that she does not drink alcohol and does not use drugs.   Family History: History of MI on her father, DM2 in maternal grandmother.  Review of Systems: Review of Systems  Constitutional:  Negative for chills and fever.  HENT:  Negative for hearing loss.   Respiratory:  Negative for shortness of breath.   Cardiovascular:  Negative for chest pain.  Gastrointestinal:  Positive for nausea. Negative for abdominal pain and vomiting.  Genitourinary:  Negative for dysuria.  Musculoskeletal:  Negative for myalgias.  Skin:  Positive for itching and rash (chronic rash from psoriasis).       Right breast pain and abscess  Neurological:  Negative for dizziness.  Psychiatric/Behavioral:  Negative for depression.    Physical Exam BP 106/69 (BP Location: Left  Arm)    Pulse 60    Temp 97.8 F (36.6 C) (Oral)    Resp 18    Ht 5\' 2"  (1.575 m)    Wt 54 kg    SpO2 98%    BMI 21.77 kg/m  CONSTITUTIONAL: No acute distress, well nourished. HEENT:  Normocephalic, atraumatic, extraocular motion intact. NECK: Trachea is midline, and there is no jugular venous distension. RESPIRATORY:  Normal respiratory effort without pathologic use of accessory muscles. CARDIOVASCULAR: Regular rhythm and  rate. BREAST:  Right breast has a 4 x 2 cm area of fluctuance is the periareolar area at 2 o'clock position, consistent with a right breast abscess.  There is surrounding erythema extending in the upper inner quadrant.  This is tender to palpation, no active drainage. MUSCULOSKELETAL:  Normal muscle strength and tone in all four extremities.  No peripheral edema or cyanosis. SKIN: Skin turgor is normal. There are no pathologic skin lesions.  NEUROLOGIC:  Motor and sensation is grossly normal.  Cranial nerves are grossly intact. PSYCH:  Alert and oriented to person, place and time. Affect is normal.  Laboratory Analysis: Results for orders placed or performed during the hospital encounter of 08/30/21 (from the past 24 hour(s))  CBC     Status: Abnormal   Collection Time: 08/30/21  8:08 AM  Result Value Ref Range   WBC 10.2 4.0 - 10.5 K/uL   RBC 4.78 3.87 - 5.11 MIL/uL   Hemoglobin 16.3 (H) 12.0 - 15.0 g/dL   HCT 09/01/21 16.1 - 09.6 %   MCV 95.0 80.0 - 100.0 fL   MCH 34.1 (H) 26.0 - 34.0 pg   MCHC 35.9 30.0 - 36.0 g/dL   RDW 04.5 40.9 - 81.1 %   Platelets 451 (H) 150 - 400 K/uL   nRBC 0.0 0.0 - 0.2 %    Imaging: 91.4 CHEST SOFT TISSUE  Result Date: 08/30/2021 CLINICAL DATA:  Right breast swelling EXAM: ULTRASOUND OF CHEST SOFT TISSUES TECHNIQUE: Ultrasound examination of the chest wall soft tissues was performed in the area of clinical concern. COMPARISON:  None. FINDINGS: There is 4 x 2.1 x 2.8 cm smooth marginated mixed echogenic lesion in the subcutaneous plane at 2 o'clock position in the right breast 1 cm from the nipple. Differential diagnostic possibilities would include inflammatory phlegmon or early abscess or neoplastic process. There is demonstrable internal vascularity in the color Doppler examination. There is no definite demonstrable thick-walled loculated fluid collection in the area of patient's symptoms. IMPRESSION: There is 4 x 2.1 x 2.8 cm mixed echogenic lesion with smooth  margins in the area of patient's symptoms at 2 o'clock position 1 cm from the nipple in the subcutaneous plane. This may suggest inflammatory phlegmon or early abscess or neoplastic process. Electronically Signed   By: 09/01/2021 M.D.   On: 08/30/2021 09:16    Assessment and Plan: This is a 35 y.o. female with a right breast abscess  --Discussed with the patient her laboratory findings and the ultrasound findings.  I have personally viewed the imaging study and agree with the findings.  On exam, the patient does have a clinical right breast abscess which is about 4 x 2 cm area of fluctuance.  Discussed with her that I do not think antibiotics alone would be enough to treat this abscess and the best course of action will be to proceed with an incision and drainage procedure.  Discussed with her the potential options for doing this at bedside versus in the  operating room and she has elected to do it at bedside.  Reviewed with her the surgery at length including the risks of bleeding, injury to surrounding structures, the potential need for further procedures, postoperative dressing changes, pain control, antibiotics, and she is willing to proceed.   Procedure Date:  08/30/2021  Pre-operative Diagnosis: Right breast abscess  Post-operative Diagnosis: Right breast abscess  Procedure:  Incision and Drainage of right breast abscess  Surgeon:  Howie Ill, MD  Anesthesia: 5 mL of 1% lidocaine  Estimated Blood Loss:  2 ml  Specimens: Culture swab  Complications: None  Indications for Procedure:  This is a 35 y.o. female with diagnosis of right breast abscess, requiring drainage procedure.  The risks of bleeding, abscess or infection, injury to surrounding structures, and need for further procedures were all discussed with the patient and was willing to proceed.  Consent was obtained and signed.  Description of Procedure: The patient was correctly identified at bedside.   Appropriate time-outs were performed prior to procedure.  The patient's right breast was prepped and draped in usual sterile fashion.  Local anesthetic was infused intradermally.  A 1.5 cm incision was made over the abscess, revealing purulent fluid.  This fluid was swabbed for culture and sent to micro.  Small Kelly forceps were used to dissect around the abscess tissue to open any remaining pockets of purulent fluid.  After drainage was completed, the cavity was irrigated and cleaned.  The wound was packed with 1/2 inch iodoform gauze and covered with dry gauze and tape.  The patient tolerated the procedure well and all sharps were appropriately disposed of at the end of the case.  - Patient may take Tylenol and ibuprofen for pain control.  Will ask Dr. Roxan Hockey to send a prescription for oxycodone as well for breakthrough pain. - Recommend Bactrim DS course for 10 days.  We will contact the patient if the cultures show resistance to Bactrim so we can change her antibiotic if needed. - Instructed the patient on how to do packing dressing changes once daily and the outer gauze as needed to keep the area clean and dry. - Patient may shower and she can also continue trying warm compresses to help promote further drainage. - Follow-up in her office in 10 days for wound check.   Howie Ill, MD [facility] Pg:  [phone]

## 2021-08-30 NOTE — ED Notes (Signed)
Pt left AMA with form signed, Dr. Lynwood Dawley is aware.

## 2021-08-30 NOTE — ED Triage Notes (Signed)
States she noticed possible abscess area to right breast about 2 weeks ago  states area became worse yesterday

## 2021-08-31 ENCOUNTER — Telehealth: Payer: Self-pay | Admitting: Surgery

## 2021-08-31 NOTE — Telephone Encounter (Signed)
Outbound call made to the pt; voicemail left requesting a call back to schedule a f/up appt w/Zach, PA (09/08/21) per Dr. Adelene Idler request.  Thank you

## 2021-08-31 NOTE — Telephone Encounter (Signed)
-----   Message from Henrene Dodge, MD sent at 08/30/2021 11:05 AM EST ----- Regarding: post-op follow up Hi,  Just did a bedside I&D of right breast abscess in the ED.  Patient is going home from ED.  Can y'all set up an appointment with Zach on 09/08/21?    Thanks!  [employer]

## 2021-09-01 LAB — AEROBIC CULTURE W GRAM STAIN (SUPERFICIAL SPECIMEN)

## 2021-09-03 ENCOUNTER — Telehealth: Payer: Self-pay | Admitting: Pharmacist

## 2021-09-03 NOTE — Telephone Encounter (Signed)
Culture for I&D done at Emergency room on 12/26 resulted on "Abundant Streptococcus Group C"  Noted patient was discharged on Bactrim DS x 10 days.  Results discussed with ED provider Dr Herma Carson.Smith.   Received recommendation to call patient and continue current therapy if wound healing and clinically improving.  Talked to [place] today. Patient denies swelling, denies increased pain, denies redness or swelling. Reported no fever for > 24 hrs. She is calling to make follow up appointment post-procedure as well.  No change in therapy today. Patient encouraged to come back to ED if increase swellibg/pain noted or fever developed.

## 2021-09-07 NOTE — Telephone Encounter (Signed)
Outbound follow up call made to the patient; scheduled to see Thedore Mins, [place] tomorrow (09/08/21) ! 11 am.

## 2021-09-08 ENCOUNTER — Other Ambulatory Visit: Payer: Self-pay

## 2021-09-08 ENCOUNTER — Ambulatory Visit (INDEPENDENT_AMBULATORY_CARE_PROVIDER_SITE_OTHER): Payer: Commercial Managed Care - PPO | Admitting: Physician Assistant

## 2021-09-08 ENCOUNTER — Encounter: Payer: Self-pay | Admitting: Physician Assistant

## 2021-09-08 VITALS — BP 116/81 | HR 111 | Temp 98.7°F | Ht 62.0 in | Wt 133.0 lb

## 2021-09-08 DIAGNOSIS — N611 Abscess of the breast and nipple: Secondary | ICD-10-CM

## 2021-09-08 DIAGNOSIS — Z09 Encounter for follow-up examination after completed treatment for conditions other than malignant neoplasm: Secondary | ICD-10-CM

## 2021-09-08 NOTE — Progress Notes (Signed)
[facility] POST-OP OFFICE VISIT  09/08/2021  HPI: Bethany Elliott is a 36 y.o. female 9 days s/p incision and drainage of right breast abscess to the 12 o'clock position with Dr Aleen Campi. Cx grew group C strep. She was sent with 10 days of Bactrim DS.   Today, she reports that she is doing well. She is still sore but not taking pain medications. She is still packing the wound but only using a very small amount. Some serous drainage. No redness. No fever. No other complaints.   Vital signs: BP 116/81    Pulse (!) 111    Temp 98.7 F (37.1 C) (Oral)    Ht 5\' 2"  (1.575 m)    Wt 133 lb (60.3 kg)    SpO2 97%    BMI 24.33 kg/m    Physical Exam: Constitutional: Well appearing female, NAD Skin: Chaperone present , CMA), There is a small, ~5 mm opening to the 12 o'clock position in relation to the nipple on the right  breast, there is no more depth, no drainage, no erythema.   Assessment/Plan: This is a 36 y.o. female 9 days s/p incision and drainage of right breast abscess   - Pain control prn with OTC medications   - I believe she can transition to superficial dressings at this point; reviewed wound care  - Complete Abx; she has 1 more day  - She can rtc on as needed basis; She understands to call with questions/concerns.   -- 34, PA-C [facility] 09/08/2021, 11:20 AM [phone] M-F: 7am - 4pm

## 2021-09-08 NOTE — Patient Instructions (Signed)
If you have any concerns or questions, please feel free to call our office.   Incision and Drainage, Care After This sheet gives you information about how to care for yourself after your procedure. Your health care provider may also give you more specific instructions. If you have problems or questions, contact your health care provider. What can I expect after the procedure? After the procedure, it is common to have: Pain or discomfort around the incision site. Blood, fluid, or pus (drainage) from the incision. Redness and firm skin around the incision site. Follow these instructions at home: Medicines Take over-the-counter and prescription medicines only as told by your health care provider. If you were prescribed an antibiotic medicine, use or take it as told by your health care provider. Do not stop using the antibiotic even if you start to feel better. Wound care Follow instructions from your health care provider about how to take care of your wound. Make sure you: Wash your hands with soap and water before and after you change your bandage (dressing). If soap and water are not available, use hand sanitizer. Change your dressing and packing as told by your health care provider. If your dressing is dry or stuck when you try to remove it, moisten or wet the dressing with saline or water so that it can be removed without harming your skin or tissues. If your wound is packed, leave it in place until your health care provider tells you to remove it. To remove the packing, moisten or wet the packing with saline or water so that it can be removed without harming your skin or tissues. Leave stitches (sutures), skin glue, or adhesive strips in place. These skin closures may need to stay in place for 2 weeks or longer. If adhesive strip edges start to loosen and curl up, you may trim the loose edges. Do not remove adhesive strips completely unless your health care provider tells you to do that. Check  your wound every day for signs of infection. Check for: More redness, swelling, or pain. More fluid or blood. Warmth. Pus or a bad smell. If you were sent home with a drain tube in place, follow instructions from your health care provider about: How to empty it. How to care for it at home.  General instructions Rest the affected area. Do not take baths, swim, or use a hot tub until your health care provider approves. Ask your health care provider if you may take showers. You may only be allowed to take sponge baths. Return to your normal activities as told by your health care provider. Ask your health care provider what activities are safe for you. Your health care provider may put you on activity or lifting restrictions. The incision will continue to drain. It is normal to have some clear or slightly bloody drainage. The amount of drainage should lessen each day. Do not apply any creams, ointments, or liquids unless you have been told to by your health care provider. Keep all follow-up visits as told by your health care provider. This is important. Contact a health care provider if: Your cyst or abscess returns. You have a fever or chills. You have more redness, swelling, or pain around your incision. You have more fluid or blood coming from your incision. Your incision feels warm to the touch. You have pus or a bad smell coming from your incision. You have red streaks above or below the incision site. Get help right away if: You have   severe pain or bleeding. You cannot eat or drink without vomiting. You have decreased urine output. You become short of breath. You have chest pain. You cough up blood. The affected area becomes numb or starts to tingle. These symptoms may represent a serious problem that is an emergency. Do not wait to see if the symptoms will go away. Get medical help right away. Call your local emergency services (911 in the [place] Do not drive yourself to the  hospital. Summary After this procedure, it is common to have fluid, blood, or pus coming from the surgery site. Follow all home care instructions. You will be told how to take care of your incision, how to check for infection, and how to take medicines. If you were prescribed an antibiotic medicine, take it as told by your health care provider. Do not stop taking the antibiotic even if you start to feel better. Contact a health care provider if you have increased redness, swelling, or pain around your incision. Get help right away if you have chest pain, you vomit, you cough up blood, or you have shortness of breath. Keep all follow-up visits as told by your health care provider. This is important. This information is not intended to replace advice given to you by your health care provider. Make sure you discuss any questions you have with your health care provider. Document Revised: 07/23/2018 Document Reviewed: 07/23/2018 Elsevier Patient Education  2022 [employer]  

## 2023-08-17 IMAGING — US US SOFT TISSUE
1 series · 14 of 15 positions shown · non-contrast
Comparison: None.

CLINICAL DATA: Right breast swelling

EXAM:
ULTRASOUND OF CHEST SOFT TISSUES
TECHNIQUE: Ultrasound examination of the chest wall soft tissues was performed
in the area of clinical concern.

[Series 1: us chest soft tissue · 15 acquisitions, 14 frames shown]
[im 1/15]
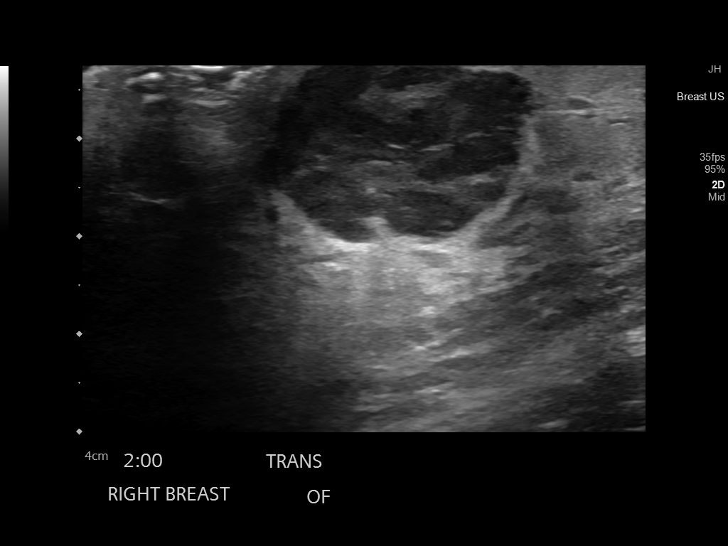
[im 2/15]
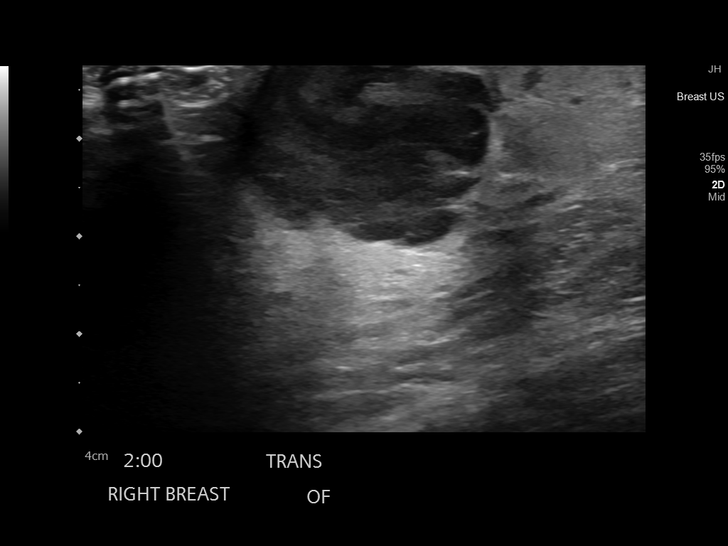
[im 3/15]
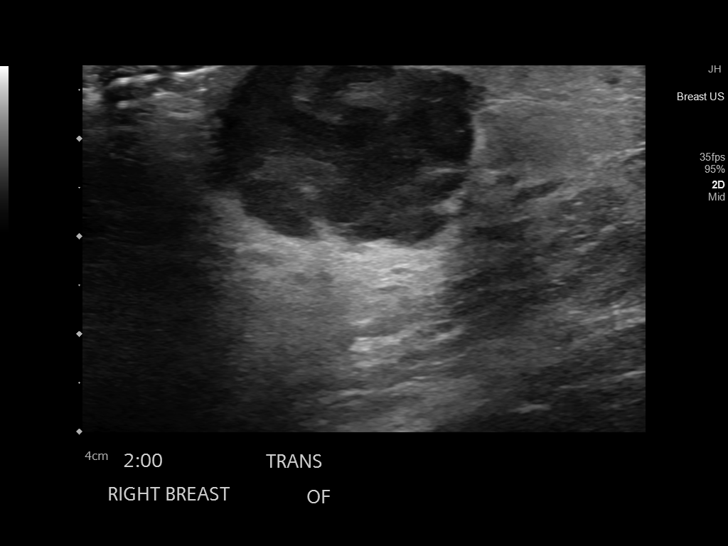
[im 4/15]
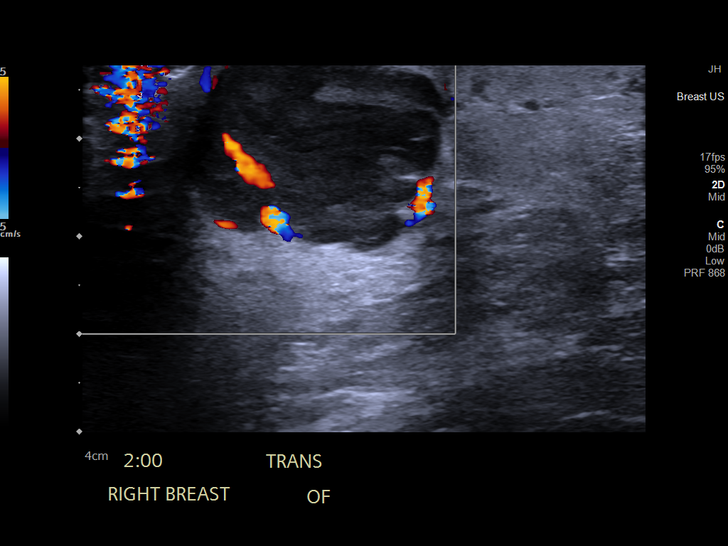
[im 5/15]
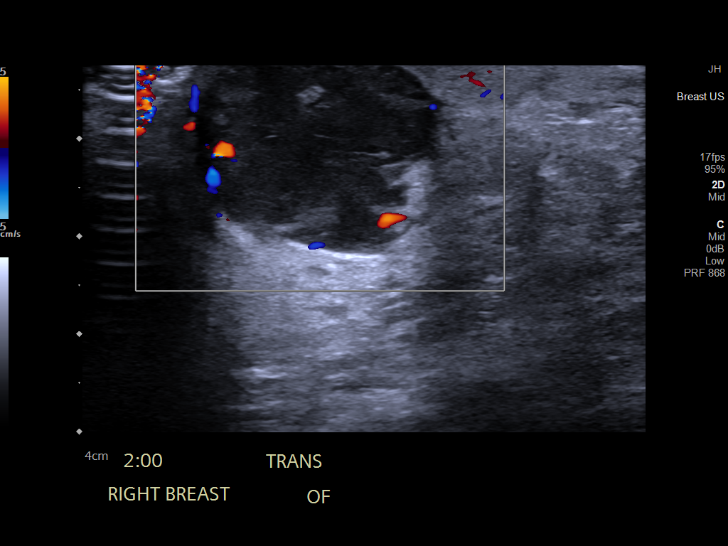
[im 6/15]
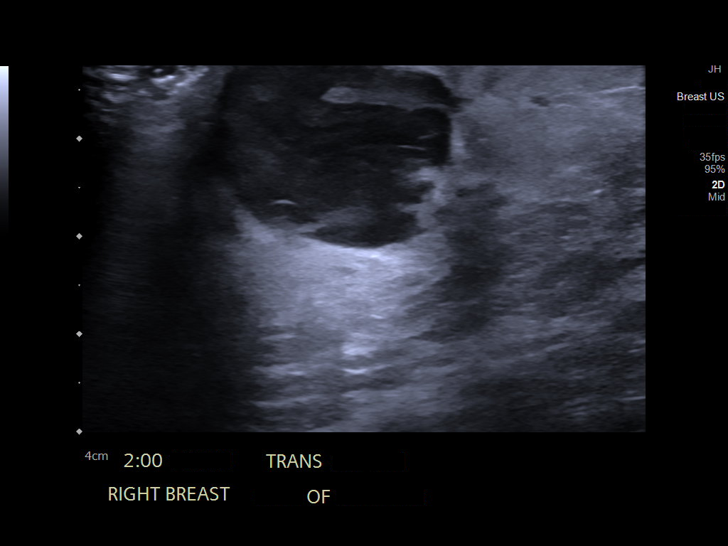
[im 7/15]
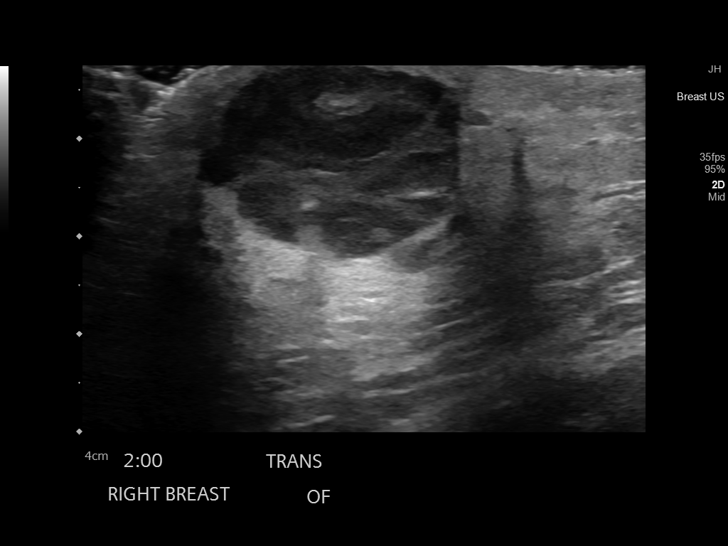
[im 9/15]
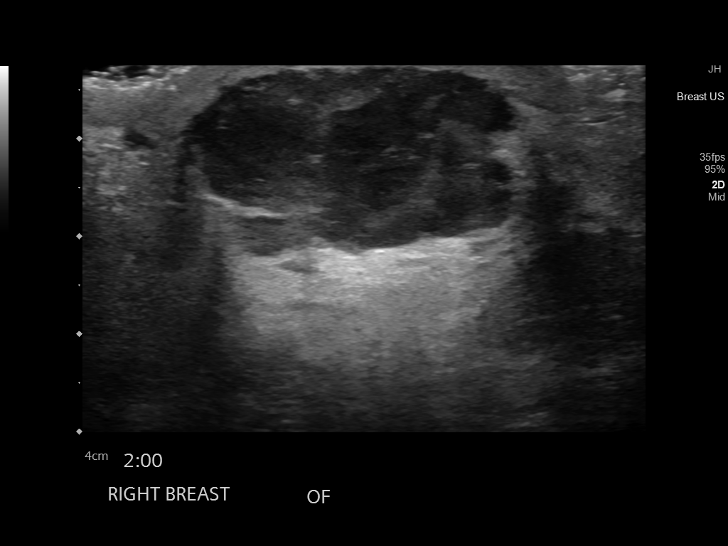
[im 10/15]
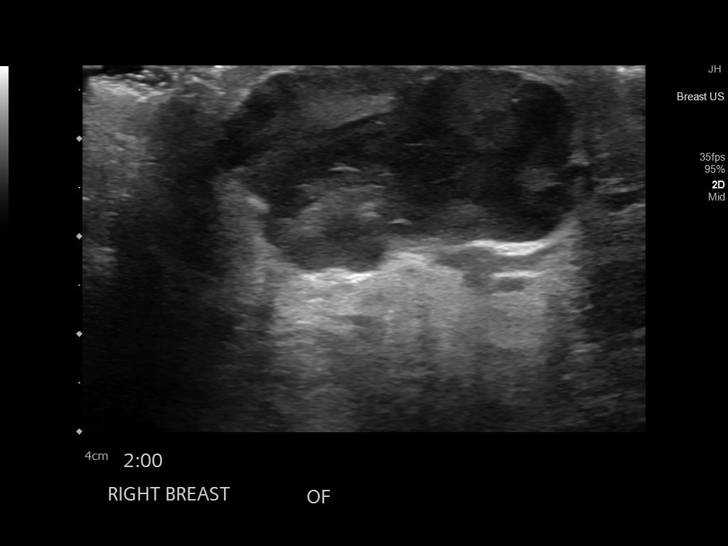
[im 11/15]
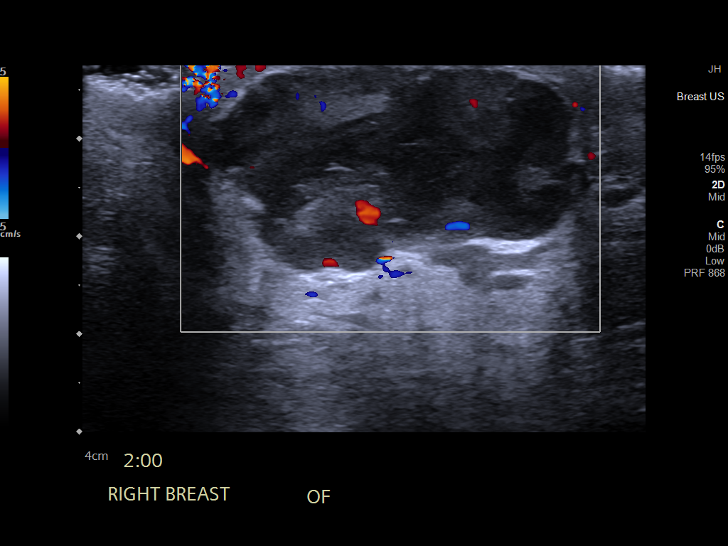
[im 12/15]
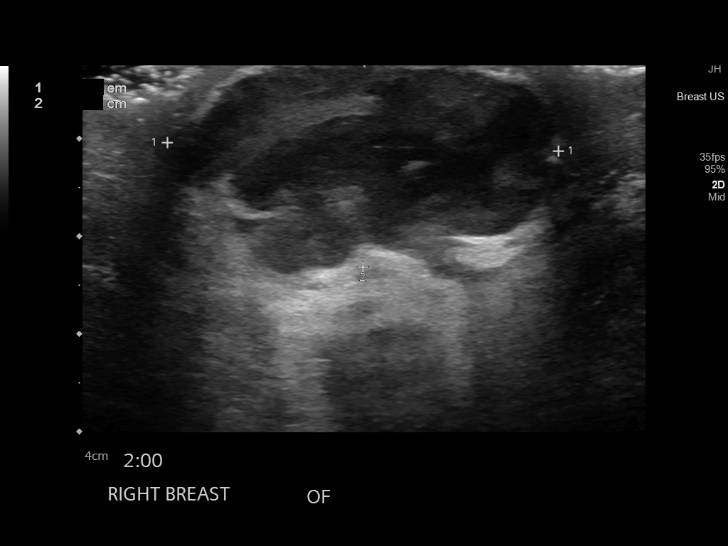
[im 13/15]
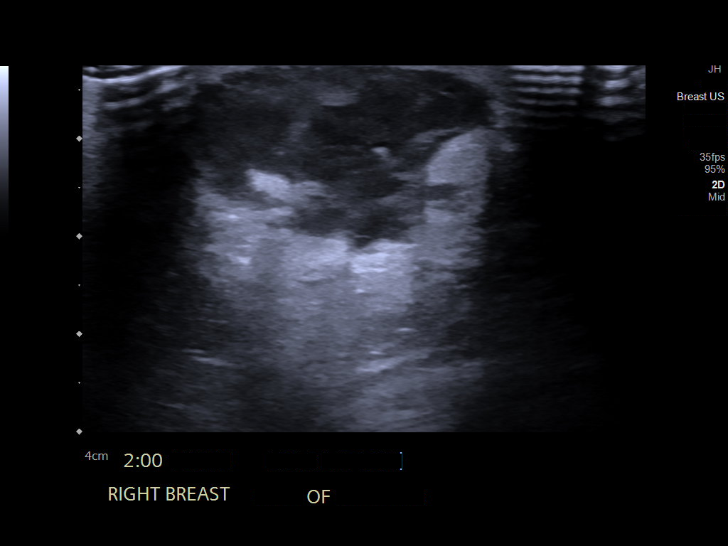
[im 14/15]
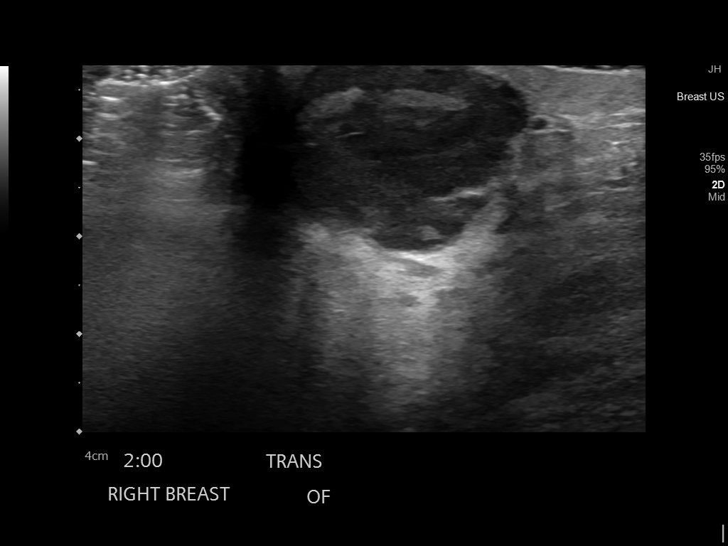
[im 15/15]
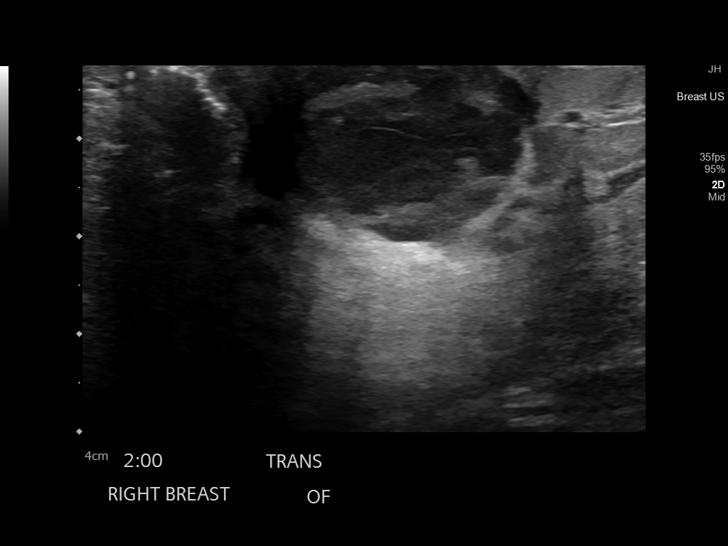

[14 of 15 positions shown; findings below may reference images not displayed]

FINDINGS: There is 4 x 2.1 x 2.8 cm smooth marginated mixed echogenic lesion
in the subcutaneous plane at 2 o'clock position in the right breast
1 cm from the nipple. Differential diagnostic possibilities would
include inflammatory phlegmon or early abscess or neoplastic
process. There is demonstrable internal vascularity in the color
Doppler examination. There is no definite demonstrable thick-walled
loculated fluid collection in the area of patient's symptoms.
IMPRESSION: There is 4 x 2.1 x 2.8 cm mixed echogenic lesion with smooth margins
in the area of patient's symptoms at 2 o'clock position 1 cm from
the nipple in the subcutaneous plane. This may suggest inflammatory
phlegmon or early abscess or neoplastic process.
